# Patient Record
Sex: Female | Born: 1940 | Race: Black or African American | Hispanic: No | Smoking: Never smoker
Health system: Southern US, Community
[De-identification: ages and names within clinical notes are randomized; demographics above are authoritative.]

## PROBLEM LIST (undated history)

## (undated) DIAGNOSIS — H409 Unspecified glaucoma: Secondary | ICD-10-CM

## (undated) HISTORY — PX: EYE SURGERY: SHX253

## (undated) HISTORY — PX: ABDOMINAL HYSTERECTOMY: SHX81

## (undated) HISTORY — DX: Unspecified glaucoma: H40.9

---

## 1987-07-22 HISTORY — PX: BUNIONECTOMY: SHX129

## 1998-01-23 ENCOUNTER — Other Ambulatory Visit: Admission: RE | Admit: 1998-01-23 | Discharge: 1998-01-23 | Payer: Self-pay | Admitting: Obstetrics

## 1998-02-15 ENCOUNTER — Ambulatory Visit (HOSPITAL_COMMUNITY): Admission: RE | Admit: 1998-02-15 | Discharge: 1998-02-15 | Payer: Self-pay | Admitting: Cardiology

## 1999-02-18 ENCOUNTER — Other Ambulatory Visit: Admission: RE | Admit: 1999-02-18 | Discharge: 1999-02-18 | Payer: Self-pay | Admitting: Obstetrics

## 2000-01-27 ENCOUNTER — Other Ambulatory Visit: Admission: RE | Admit: 2000-01-27 | Discharge: 2000-01-27 | Payer: Self-pay | Admitting: Obstetrics

## 2003-05-23 ENCOUNTER — Other Ambulatory Visit: Admission: RE | Admit: 2003-05-23 | Discharge: 2003-05-23 | Payer: Self-pay | Admitting: Obstetrics and Gynecology

## 2003-07-22 HISTORY — PX: BUNIONECTOMY: SHX129

## 2004-02-23 ENCOUNTER — Encounter: Admission: RE | Admit: 2004-02-23 | Discharge: 2004-04-16 | Payer: Self-pay | Admitting: Podiatry

## 2006-01-13 ENCOUNTER — Ambulatory Visit (HOSPITAL_COMMUNITY): Admission: RE | Admit: 2006-01-13 | Discharge: 2006-01-13 | Payer: Self-pay | Admitting: Gastroenterology

## 2006-03-12 ENCOUNTER — Encounter: Admission: RE | Admit: 2006-03-12 | Discharge: 2006-03-12 | Payer: Self-pay | Admitting: Podiatry

## 2006-04-27 ENCOUNTER — Encounter: Admission: RE | Admit: 2006-04-27 | Discharge: 2006-04-27 | Payer: Self-pay | Admitting: Internal Medicine

## 2006-05-12 ENCOUNTER — Ambulatory Visit (HOSPITAL_COMMUNITY): Admission: RE | Admit: 2006-05-12 | Discharge: 2006-05-12 | Payer: Self-pay | Admitting: Internal Medicine

## 2006-07-21 HISTORY — PX: HAMMER TOE SURGERY: SHX385

## 2006-10-01 ENCOUNTER — Encounter: Admission: RE | Admit: 2006-10-01 | Discharge: 2006-10-01 | Payer: Self-pay | Admitting: General Surgery

## 2006-10-14 ENCOUNTER — Encounter: Admission: RE | Admit: 2006-10-14 | Discharge: 2006-10-14 | Payer: Self-pay | Admitting: General Surgery

## 2006-10-14 ENCOUNTER — Encounter (INDEPENDENT_AMBULATORY_CARE_PROVIDER_SITE_OTHER): Payer: Self-pay | Admitting: Specialist

## 2006-10-14 ENCOUNTER — Other Ambulatory Visit: Admission: RE | Admit: 2006-10-14 | Discharge: 2006-10-14 | Payer: Self-pay | Admitting: Diagnostic Radiology

## 2007-05-28 ENCOUNTER — Ambulatory Visit: Payer: Self-pay | Admitting: Internal Medicine

## 2007-06-11 ENCOUNTER — Encounter: Payer: Self-pay | Admitting: Internal Medicine

## 2007-06-11 ENCOUNTER — Ambulatory Visit: Payer: Self-pay | Admitting: Internal Medicine

## 2007-10-06 ENCOUNTER — Encounter: Admission: RE | Admit: 2007-10-06 | Discharge: 2007-10-06 | Payer: Self-pay

## 2007-10-20 ENCOUNTER — Encounter (INDEPENDENT_AMBULATORY_CARE_PROVIDER_SITE_OTHER): Payer: Self-pay | Admitting: Interventional Radiology

## 2007-10-20 ENCOUNTER — Other Ambulatory Visit: Admission: RE | Admit: 2007-10-20 | Discharge: 2007-10-20 | Payer: Self-pay | Admitting: Interventional Radiology

## 2007-10-20 ENCOUNTER — Encounter: Admission: RE | Admit: 2007-10-20 | Discharge: 2007-10-20 | Payer: Self-pay | Admitting: Internal Medicine

## 2008-08-04 IMAGING — CR DG FOOT COMPLETE 3+V*R*
3 series · 3 of 3 positions shown · non-contrast
Comparison: [REDACTED] right foot radiographs 03/12/06.

CLINICAL DATA: Painful right foot.  Postop right first metatarsal tarsal and distal right second metatarsal with persistent right foot pain. 
NUCLEAR MEDICINE 3-PHASE BONE SCAN BILATERAL FEET:
TECHNIQUE: Radionuclide angiographic, immediate static, and 3-hour delayed images were obtained after intravenous injection of radiopharmaceutical.
Radiopharmaceutical:  24.8 mCi Mc-RRm MDP.

[t foot ap right]
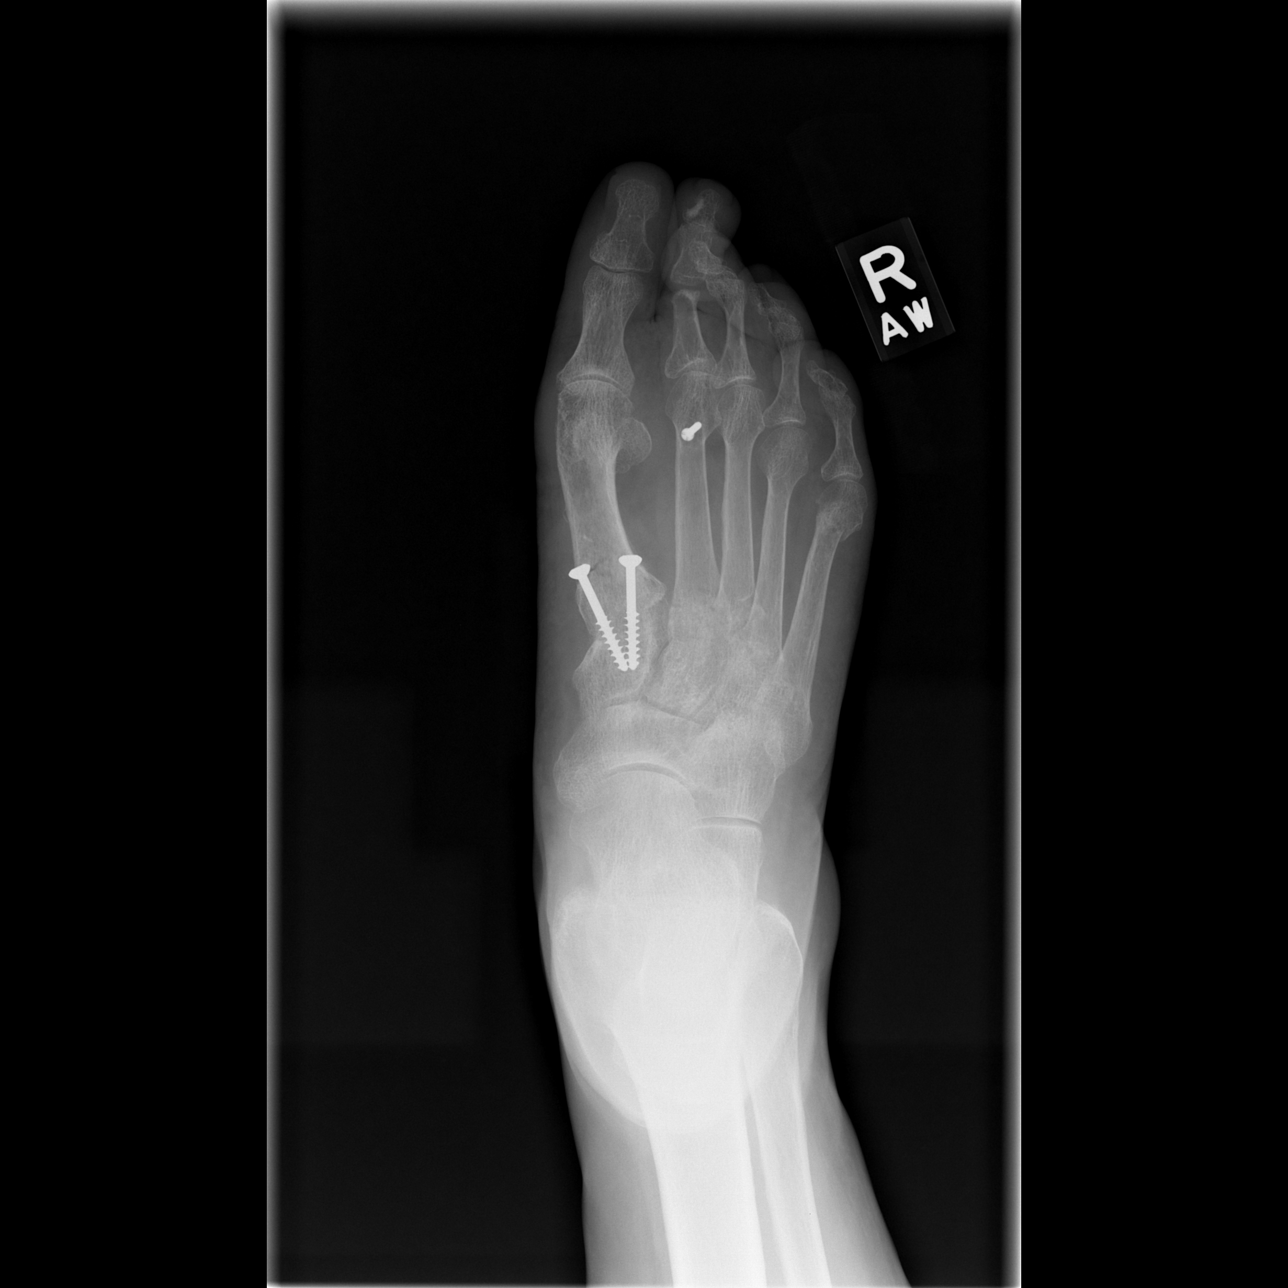

[t foot oblique right]
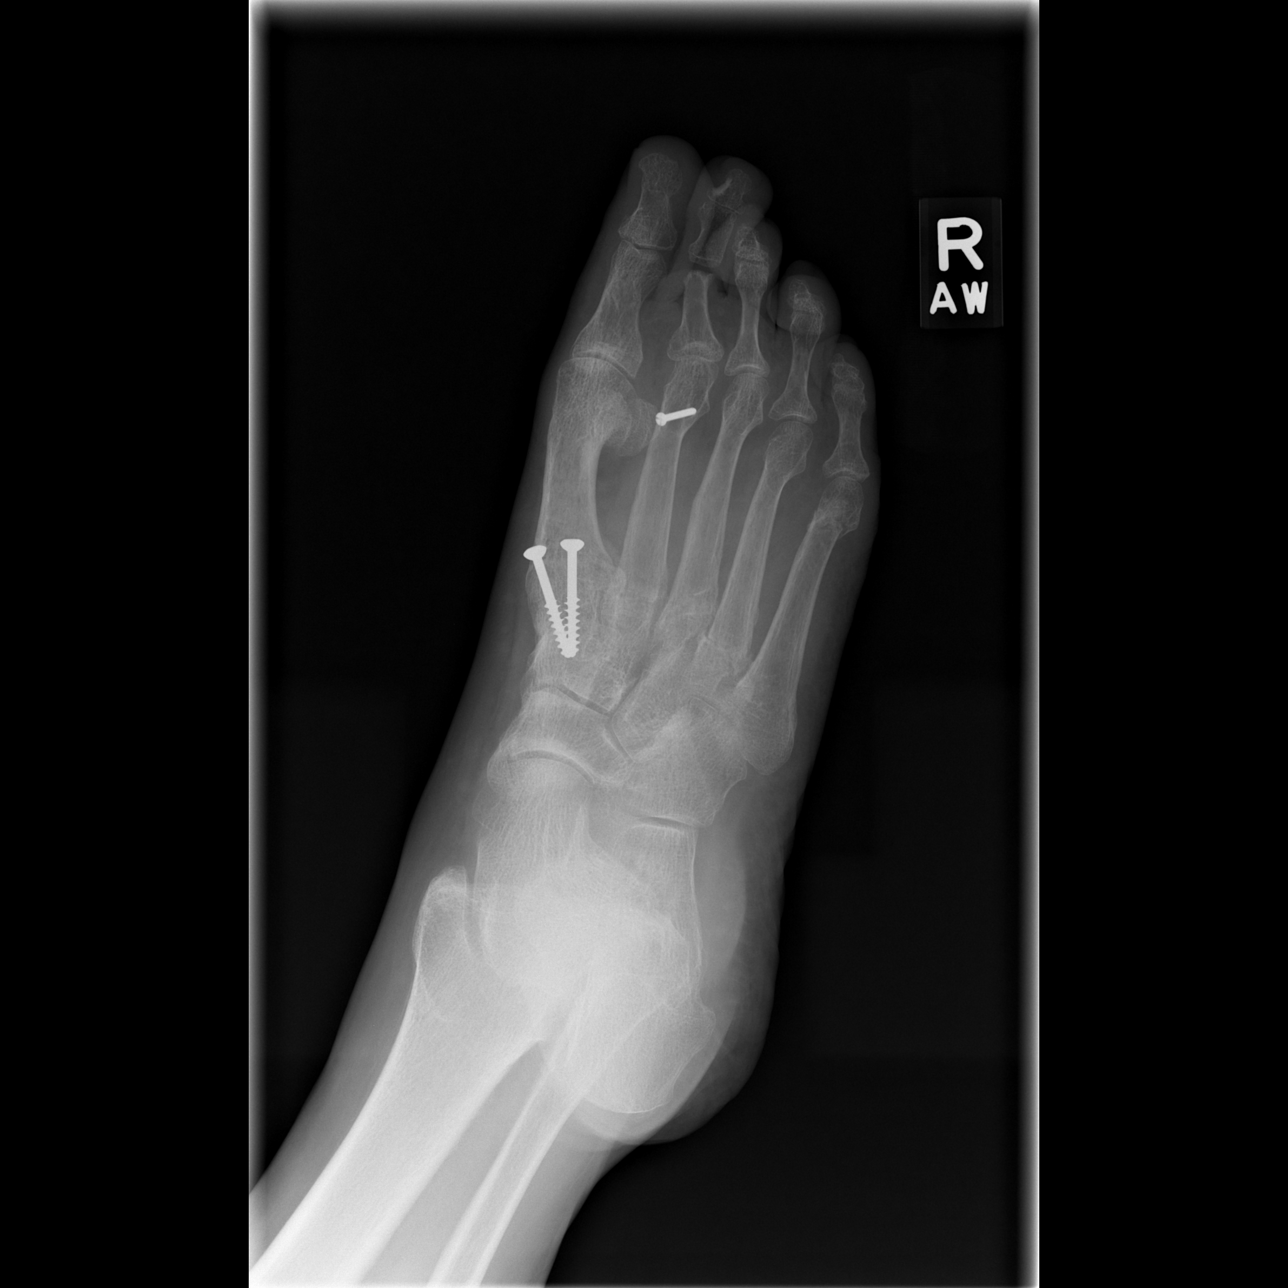

[t foot lat right]
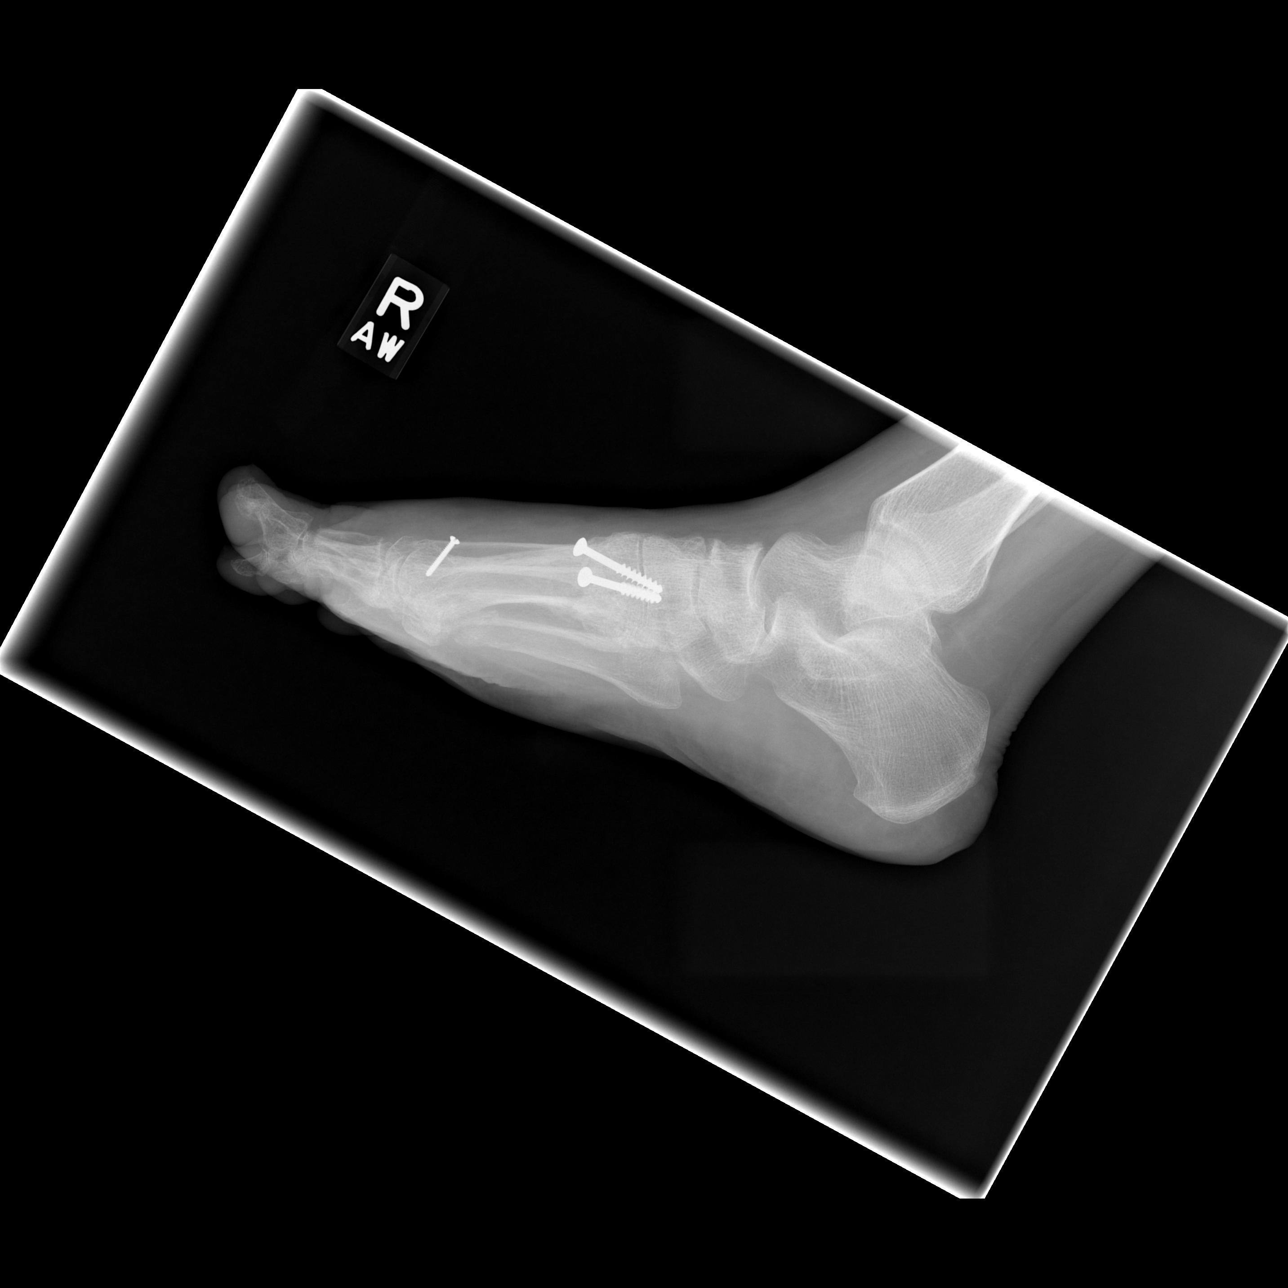

[3 of 3 positions shown; findings below may reference images not displayed]

FINDINGS: Dynamic first phase demonstrate increased uptake to the right midtarsal bones with progressive uptake through second and third phases at the level of the metatarsals and metatarsal tarsal articulations.  Specific uptake is seen most prominently at first through third bases of metatarsals and at the right 5th metatarsal head.  Lesser uptake is seen at the metatarsophalangeal joint 1-5 right.  No abnormality is seen at the left foot.
IMPRESSION: 1.  Progressive increased uptake primarily at the proximal right first through third metatarsals with radiographically demonstrated subtle fractures at the base of the right first , fourth and possibly second metatarsals.  Increased uptake is seen at the nondisplaced fracture at the right 5th metatarsal neck.  Lesser focal uptake is seen diffusely at the metatarsophalangeal joints at the right first through fifth toes. Reflex sympathetic dystrophy syndrome is possible yet felt less likely.
2.  Otherwise no significant abnormality. 

RIGHT FOOT ? 3 VIEW:
FINDINGS: Subtle nondisplaced fractures are seen at the bases of the right first and fourth metatarsal and probably medially at the second base metatarsal.  Nondisplaced fracture is seen at the right fifth metatarsal neck.  Transfixion screws are seen with osseous fusion at the right first metatarsal tarsal joint and screw at right second metatarsal neck.  No radiographic evidence for acute or chronic osteomyelitis is seen with diffuse osteopenia.  Probable postmenopausal etiology of osteopenia is noted although reflex sympathetic dystrophy syndrome is possible.
IMPRESSION: 1.  Subtle fractures right first, second, fourth, and fifth metatarsals with diffuse osteopenia. 
2.  Postop changes.
3.  Otherwise no significant abnormality.

## 2010-08-10 ENCOUNTER — Encounter: Payer: Self-pay | Admitting: Internal Medicine

## 2011-05-19 ENCOUNTER — Other Ambulatory Visit: Payer: Self-pay | Admitting: Ophthalmology

## 2011-07-22 HISTORY — PX: HAMMER TOE SURGERY: SHX385

## 2012-05-19 ENCOUNTER — Ambulatory Visit (INDEPENDENT_AMBULATORY_CARE_PROVIDER_SITE_OTHER): Payer: Medicare Other | Admitting: *Deleted

## 2012-05-19 DIAGNOSIS — Z23 Encounter for immunization: Secondary | ICD-10-CM

## 2012-07-16 ENCOUNTER — Encounter: Payer: Self-pay | Admitting: Internal Medicine

## 2012-10-15 ENCOUNTER — Other Ambulatory Visit: Payer: Self-pay | Admitting: Ophthalmology

## 2012-10-15 ENCOUNTER — Ambulatory Visit
Admission: RE | Admit: 2012-10-15 | Discharge: 2012-10-15 | Disposition: A | Discharge status: Home / Self Care | Payer: Medicare Other | Source: Ambulatory Visit | Attending: Ophthalmology | Admitting: Ophthalmology

## 2012-10-15 DIAGNOSIS — H0520 Unspecified exophthalmos: Secondary | ICD-10-CM

## 2013-03-18 ENCOUNTER — Encounter: Payer: Self-pay | Admitting: Internal Medicine

## 2013-06-13 ENCOUNTER — Ambulatory Visit (INDEPENDENT_AMBULATORY_CARE_PROVIDER_SITE_OTHER): Payer: Medicare Other | Admitting: *Deleted

## 2013-06-13 DIAGNOSIS — Z23 Encounter for immunization: Secondary | ICD-10-CM

## 2013-06-13 NOTE — Progress Notes (Signed)
  Subjective:    Patient ID: Paige Ross, female    DOB: August 02, 1940, 72 y.o.   MRN: [id number]  HPI    Review of Systems     Objective:   Physical Exam        Assessment & Plan:  Patient here for flu injection only.

## 2014-05-21 LAB — HM MAMMOGRAPHY

## 2014-06-09 ENCOUNTER — Ambulatory Visit (INDEPENDENT_AMBULATORY_CARE_PROVIDER_SITE_OTHER): Payer: Medicare Other | Admitting: *Deleted

## 2014-06-09 DIAGNOSIS — Z23 Encounter for immunization: Secondary | ICD-10-CM

## 2014-09-22 ENCOUNTER — Ambulatory Visit (INDEPENDENT_AMBULATORY_CARE_PROVIDER_SITE_OTHER): Payer: Medicare Other | Admitting: Podiatry

## 2014-09-22 ENCOUNTER — Encounter: Payer: Self-pay | Admitting: Podiatry

## 2014-09-22 VITALS — BP 119/65 | HR 64 | Ht 62.0 in | Wt 165.0 lb

## 2014-09-22 DIAGNOSIS — B351 Tinea unguium: Secondary | ICD-10-CM

## 2014-09-22 DIAGNOSIS — L600 Ingrowing nail: Secondary | ICD-10-CM

## 2014-09-22 DIAGNOSIS — M79676 Pain in unspecified toe(s): Secondary | ICD-10-CM | POA: Diagnosis not present

## 2014-09-22 DIAGNOSIS — L84 Corns and callosities: Secondary | ICD-10-CM

## 2014-09-22 DIAGNOSIS — H409 Unspecified glaucoma: Secondary | ICD-10-CM | POA: Insufficient documentation

## 2014-09-22 NOTE — Progress Notes (Signed)
   Subjective:    Patient ID: Paige SpinnerDemetra D Ross, female    DOB: 03-31-1941, 74 y.o.   MRN: [id number]  HPI 74 year old female presents the office today with complaints of painful, thick, elongated toenails as well as some hard skin on areas of the toes. She denies any redness or drainage on the nail sites. No other complaints at this time. Denies any systemic complaints such as fevers, chills, nausea, vomiting.   Review of Systems  All other systems reviewed and are negative.      Objective:   Physical Exam AAO 3, NAD DP/PT pulses palpable, CRT less than 3 seconds Protective sensation intact with Simms weinstein monofilament, vibratory sensation intact, Achilles tendon reflex intact. Nails are hypertrophic, dystrophic, elongated, brittle, discolored with mild incurvation of the nail borders 10. Upon debridement the lateral aspect of the right hallux nail distally there was a small of serous drainage. Upon debridement of the nail no further drainage was identified and there was no further tenderness after debridement. There is injected tenderness along nails 1-5 bilaterally. No swelling erythema around the nails. There is small hyperkeratotic lesion on the lateral aspect of the right fifth toe as well as the right first, left second and third digits adjacent to the nails. Upon debridement lesions there is no underlying ulceration, drainage or other signs of infection. No other areas of tenderness to bilateral lower extremities.  No other open lesions or pre-ulcer lesions identified bilaterally No pain with calf compression, swelling, warmth, erythema      Assessment & Plan:  74 year old female symptomatic onychomycosis/ingrown toenail, hyperkerotic lesions -Treatment options were discussed with patient including alternatives, risks, complications -Nail sharply debrided 10 without complication/bleeding. Upon debridement of the lateral aspect of the right hallux toenail there was a  small, serous drainage. Discussed the patient partial nail avulsion however she declined at this time. After debridement there was no further drainage and no further tenderness upon palpation. Recommended close evaluation of this area and there is any changes to call the office immediately. -Hyperkeratotic lesion sharply debrided without complications/bleeding. -Discussed daily foot inspection. -Follow-up in 3 months or sooner should any problems arise. In the meantime, cursory call the office if any questions, concerns, change in symptoms.

## 2014-09-22 NOTE — Patient Instructions (Signed)

## 2015-01-10 ENCOUNTER — Encounter: Payer: Self-pay | Admitting: *Deleted

## 2015-04-11 ENCOUNTER — Encounter: Payer: Self-pay | Admitting: Podiatry

## 2015-04-11 ENCOUNTER — Ambulatory Visit (INDEPENDENT_AMBULATORY_CARE_PROVIDER_SITE_OTHER): Payer: Medicare Other | Admitting: Podiatry

## 2015-04-11 VITALS — BP 133/71 | HR 64 | Resp 16

## 2015-04-11 DIAGNOSIS — B351 Tinea unguium: Secondary | ICD-10-CM | POA: Diagnosis not present

## 2015-04-11 DIAGNOSIS — M79676 Pain in unspecified toe(s): Secondary | ICD-10-CM | POA: Diagnosis not present

## 2015-04-11 DIAGNOSIS — L84 Corns and callosities: Secondary | ICD-10-CM

## 2015-04-11 NOTE — Progress Notes (Signed)
Subjective:     Patient ID: Paige Ross, female   DOB: Dec 03, 1940, 74 y.o.   MRN: [id number]  HPI patient presents with nail disease and thickness 1-5 both feet that patient cannot cut along with keratotic lesion third and fourth toes right third left that are painful when pressed   Review of Systems     Objective:   Physical Exam Neurovascular status unchanged with thick yellow brittle nailbeds and keratotic lesions bilateral    Assessment:     Mycotic nail infection with lesion formation    Plan:     Debride nailbeds that are painful 1-5 both feet and debridement lesions on both feet with no iatrogenic bleeding noted

## 2015-04-26 ENCOUNTER — Ambulatory Visit (INDEPENDENT_AMBULATORY_CARE_PROVIDER_SITE_OTHER): Payer: Medicare Other | Admitting: Podiatry

## 2015-04-26 ENCOUNTER — Encounter: Payer: Self-pay | Admitting: Podiatry

## 2015-04-26 DIAGNOSIS — M79676 Pain in unspecified toe(s): Secondary | ICD-10-CM

## 2015-04-26 DIAGNOSIS — B351 Tinea unguium: Secondary | ICD-10-CM | POA: Diagnosis not present

## 2015-04-28 NOTE — Progress Notes (Signed)
Subjective:     Patient ID: Paige Ross, female   DOB: [birth year], 74 y.o.   MRN: [id number]  HPI patient presents with nail disease 1-5 both feet along with lesions that can become painful fifth toe bilateral   Review of Systems     Objective:   Physical Exam Neurovascular status intact no other health history changes noted with patient having disease nailbeds 1-5 both feet with thickness yellow brittle type appearance with pain and keratotic lesions fifth toe both feet    Assessment:     Mycotic nail infections with lesion formation    Plan:     Debride painful nailbeds 1-5 both feet and lesions with no iatrogenic bleeding noted

## 2015-06-11 ENCOUNTER — Ambulatory Visit (INDEPENDENT_AMBULATORY_CARE_PROVIDER_SITE_OTHER): Payer: Medicare Other

## 2015-06-11 DIAGNOSIS — Z23 Encounter for immunization: Secondary | ICD-10-CM

## 2015-06-27 ENCOUNTER — Ambulatory Visit (INDEPENDENT_AMBULATORY_CARE_PROVIDER_SITE_OTHER): Payer: Medicare Other | Admitting: Podiatry

## 2015-06-27 ENCOUNTER — Encounter: Payer: Self-pay | Admitting: Podiatry

## 2015-06-27 VITALS — BP 143/85 | HR 75 | Resp 16

## 2015-06-27 DIAGNOSIS — L600 Ingrowing nail: Secondary | ICD-10-CM | POA: Diagnosis not present

## 2015-06-27 DIAGNOSIS — M79673 Pain in unspecified foot: Secondary | ICD-10-CM

## 2015-06-27 NOTE — Patient Instructions (Signed)

## 2015-06-28 NOTE — Progress Notes (Signed)
Subjective:     Patient ID: Paige Ross, female   DOB: 05/06/1941, 74 y.o.   MRN: [id number]  HPI patient presents with a damaged big toenail right lateral border that's very sore and she cannot cut or trim herself   Review of Systems     Objective:   Physical Exam Neurovascular status was found to be intact with muscle strength adequate range of motion within normal limits. Patient is noted to have an incurvated right hallux lateral border that's painful when pressed with distal redness but no current drainage noted    Assessment:     Ingrown toenail deformity right hallux lateral border with pain    Plan:     Discussed treatment options and opted for correction explaining procedure and complications to patient. She wants the procedure and today I infiltrated 60 mg Xylocaine Marcaine mixture and under sterile conditions remove the lateral border exposed matrix and applied phenol 3 applications 30 seconds followed by alcohol lavage and sterile dressing. Gave instructions on soaks and reappoint

## 2015-07-02 ENCOUNTER — Telehealth: Payer: Self-pay | Admitting: *Deleted

## 2015-07-02 NOTE — Telephone Encounter (Signed)
Called patient at [phone] (Home #) to check to see how they were doing from their ingrown toenail procedure that was performed on Wednesday, June 27, 2015. Pt stated, " Feeling okay and has no pain". Pt has been soaking toe as well.

## 2015-10-19 ENCOUNTER — Ambulatory Visit (INDEPENDENT_AMBULATORY_CARE_PROVIDER_SITE_OTHER): Payer: Medicare Other | Admitting: Podiatry

## 2015-10-19 ENCOUNTER — Encounter: Payer: Self-pay | Admitting: Podiatry

## 2015-10-19 DIAGNOSIS — L600 Ingrowing nail: Secondary | ICD-10-CM | POA: Diagnosis not present

## 2015-10-19 DIAGNOSIS — L84 Corns and callosities: Secondary | ICD-10-CM

## 2015-10-19 NOTE — Progress Notes (Signed)
Subjective:     Patient ID: Paige Ross, female   DOB: November 18, 1940, 75 y.o.   MRN: [id number]  HPI patient presents with soreness on the right hallux lateral border localized in nature and keratotic lesions plantar aspect both feet that are painful   Review of Systems     Objective:   Physical Exam Neurovascular status intact with slight incurvation hallux nail right with skin is grown up to the nail border but no drainage erythema noted. 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 If symptoms persist may require more nail removal

## 2016-02-11 ENCOUNTER — Encounter: Payer: Self-pay | Admitting: Podiatry

## 2016-02-11 ENCOUNTER — Ambulatory Visit (INDEPENDENT_AMBULATORY_CARE_PROVIDER_SITE_OTHER): Payer: Medicare Other | Admitting: Podiatry

## 2016-02-11 DIAGNOSIS — B351 Tinea unguium: Secondary | ICD-10-CM

## 2016-02-11 DIAGNOSIS — M79676 Pain in unspecified toe(s): Secondary | ICD-10-CM | POA: Diagnosis not present

## 2016-02-11 DIAGNOSIS — L84 Corns and callosities: Secondary | ICD-10-CM

## 2016-02-17 NOTE — Progress Notes (Signed)
Subjective: 75 y.o. returns the office today for painful, elongated, thickened toenails which she cannot trim herself. Denies any redness or drainage around the nails. She also has calluses she would like trimmed. Denies any acute changes since last appointment and no new complaints today. Denies any systemic complaints such as fevers, chills, nausea, vomiting.   Objective: AAO 3, NAD DP/PT pulses palpable, CRT less than 3 seconds Nails are hypertrophic, dystrophic, elongated, brittle, discolored with mild incurvation of the nail borders 10. There is tenderness along nails 1-5 bilaterally. No swelling erythema around the nails. There is small hyperkeratotic lesion on the lateral aspect of the right fifth toe as well as the right first, left second and third digits adjacent to the nails. Upon debridement lesions there is no underlying ulceration, drainage or other signs of infection. No other areas of tenderness to bilateral lower extremities.  No other open lesions or pre-ulcer lesions identified bilaterally No pain with calf compression, swelling, warmth, erythema  Assessment: Patient presents with symptomatic onychomycosis, hyperkeratotic lesions  Plan: -Treatment options including alternatives, risks, complications were discussed -Nails sharply debrided 10 without complication/bleeding. -Hyperkeratotic lesions debrided 2 without complications or bleeding. -Discussed daily foot inspection. If there are any changes, to call the office immediately.  -Follow-up in 3 months or sooner if any problems are to arise. In the meantime, encouraged to call the office with any questions, concerns, changes symptoms.  Ovid Curd, DPM

## 2016-04-22 ENCOUNTER — Ambulatory Visit: Payer: Medicare Other

## 2016-04-22 DIAGNOSIS — Z23 Encounter for immunization: Secondary | ICD-10-CM

## 2016-04-22 NOTE — Progress Notes (Unsigned)
Pt. Here for flu vaccine

## 2016-05-26 ENCOUNTER — Ambulatory Visit (INDEPENDENT_AMBULATORY_CARE_PROVIDER_SITE_OTHER): Payer: Medicare Other

## 2016-05-26 ENCOUNTER — Ambulatory Visit (INDEPENDENT_AMBULATORY_CARE_PROVIDER_SITE_OTHER): Payer: Medicare Other | Admitting: Podiatry

## 2016-05-26 DIAGNOSIS — M79676 Pain in unspecified toe(s): Secondary | ICD-10-CM | POA: Diagnosis not present

## 2016-05-26 DIAGNOSIS — R52 Pain, unspecified: Secondary | ICD-10-CM | POA: Diagnosis not present

## 2016-05-26 DIAGNOSIS — B351 Tinea unguium: Secondary | ICD-10-CM

## 2016-05-26 DIAGNOSIS — M79674 Pain in right toe(s): Secondary | ICD-10-CM

## 2016-05-26 DIAGNOSIS — Z969 Presence of functional implant, unspecified: Secondary | ICD-10-CM | POA: Diagnosis not present

## 2016-05-26 DIAGNOSIS — L84 Corns and callosities: Secondary | ICD-10-CM | POA: Diagnosis not present

## 2016-05-27 NOTE — Progress Notes (Signed)
Subjective: 75 y.o. returns the office today for painful, elongated, thickened toenails which she cannot trim herself. Denies any redness or drainage around the nails. She also has calluses she would like trimmed. She states that she feels that there is a bone on the right foot which started become painful she points the medial aspect of the right foot adjacent to the incision from the prior surgery. She denies any recent injury or trauma. Denies any open sores. Denies any swelling or redness. Denies any acute changes since last appointment and no new complaints today. Denies any systemic complaints such as fevers, chills, nausea, vomiting.   Objective: AAO 3, NAD DP/PT pulses palpable, CRT less than 3 seconds Nails are hypertrophic, dystrophic, elongated, brittle, discolored with mild incurvation of the nail borders 10. There is tenderness along nails 1-5 bilaterally. No swelling erythema around the nails. There is small hyperkeratotic lesion on the lateral aspect of the right fifth toe. Upon debridement lesions there is no underlying ulceration, drainage or other signs of infection. There is a palpable screw present on the right foot with mild tenderness to palpation over the area. She states the areas only really tender with pressure in shoe gear. There is no edema, erythema or any open sore any skin irritation.; , Hardware No other areas of tenderness to bilateral lower extremities.  No other open lesions or pre-ulcer lesions identified bilaterally No pain with calf compression, swelling, warmth, erythema  Assessment: Patient presents with symptomatic onychomycosis, hyperkeratotic lesions  Plan: -Treatment options including alternatives, risks, complications were discussed -Nails sharply debrided 10 without complication/bleeding. -Hyperkeratotic lesions debrided 2 without complications or bleeding. -X-rays were obtained and reviewed which didn't reveal prominent hardware. Skin marker was  utilized to confirm this. I discussed with her both conservative and surgical options. For now she will continue to monitor area. Offloading pads. She gear changes. In the future if symptoms worsen discussed surgical removal of the hardware. -Discussed daily foot inspection. If there are any changes, to call the office immediately.  -Follow-up in 3 months or sooner if any problems are to arise. In the meantime, encouraged to call the office with any questions, concerns, changes symptoms.  Ovid CurdMatthew Wagoner, DPM

## 2016-05-30 ENCOUNTER — Ambulatory Visit: Payer: Medicare Other | Admitting: Podiatry

## 2016-07-23 ENCOUNTER — Encounter (INDEPENDENT_AMBULATORY_CARE_PROVIDER_SITE_OTHER): Payer: Self-pay

## 2016-07-23 ENCOUNTER — Other Ambulatory Visit: Payer: Self-pay | Admitting: Internal Medicine

## 2016-07-23 ENCOUNTER — Ambulatory Visit (HOSPITAL_COMMUNITY)
Admission: RE | Admit: 2016-07-23 | Discharge: 2016-07-23 | Disposition: A | Discharge status: Home / Self Care | Payer: Medicare Other | Source: Ambulatory Visit | Attending: Internal Medicine | Admitting: Internal Medicine

## 2016-07-23 DIAGNOSIS — M25552 Pain in left hip: Secondary | ICD-10-CM | POA: Diagnosis not present

## 2016-07-23 DIAGNOSIS — R52 Pain, unspecified: Secondary | ICD-10-CM

## 2016-07-23 DIAGNOSIS — M25551 Pain in right hip: Secondary | ICD-10-CM | POA: Insufficient documentation

## 2016-07-23 DIAGNOSIS — M160 Bilateral primary osteoarthritis of hip: Secondary | ICD-10-CM | POA: Diagnosis not present

## 2016-07-24 ENCOUNTER — Ambulatory Visit (INDEPENDENT_AMBULATORY_CARE_PROVIDER_SITE_OTHER): Payer: Medicare Other | Admitting: Podiatry

## 2016-07-24 DIAGNOSIS — L84 Corns and callosities: Secondary | ICD-10-CM

## 2016-07-24 DIAGNOSIS — B351 Tinea unguium: Secondary | ICD-10-CM

## 2016-07-24 DIAGNOSIS — M79676 Pain in unspecified toe(s): Secondary | ICD-10-CM

## 2016-07-25 NOTE — Progress Notes (Signed)
Subjective: 76 y.o. returns the office today for painful painful calluses to her feet as well as for painful, elongated toenails to bilateral big toe and fifth digit toenails. She states the majority painful fifth toe on the hyperkeratotic lesion. She denies any recent injury or trauma. Denies any swelling or redness to her feet. Denies any acute changes since last appointment and no new complaints today. Denies any systemic complaints such as fevers, chills, nausea, vomiting.   Objective: AAO 3, NAD DP/PT pulses palpable, CRT less than 3 seconds Nails are hypertrophic, dystrophic, elongated, brittle, discolored with mild incurvation of the nail borders 4. There is tenderness along nails 1 and 5 bilaterallysurroundinghema around the nails. There is small hyperkeratotic lesion on the lateral aspect of the right fifth toe. Upon debridement lesions there is no underlying ulceration, drainage or other signs of infection.is also hyperkeratotic lesion to the right foot submetatarsal one. Upon debridement there is no underlying ulceration, drainage or any signs of infection.  No other areas of tenderness to bilateral lower extremities.  No other open lesions or pre-ulcer lesions identified bilaterally No pain with calf compression, swelling, warmth, erythema  Assessment: Patient presents with symptomatic onychomycosis, hyperkeratotic lesions  Plan: -Treatment options including alternatives, risks, complications were discussed -Nails sharply debrided 4 without complication/bleeding. -Hyperkeratotic lesions debrided 2 without complications or bleeding. -Arthroplasty of the toe. -Discussed daily foot inspection. If there are any changes, to call the office immediately.  -Follow-up in 3 months or sooner if any problems are to arise. In the meantime, encouraged to call the office with any questions, concerns, changes symptoms.  Ovid CurdMatthew Jhoselyn Ruffini, DPM

## 2016-12-08 ENCOUNTER — Ambulatory Visit: Payer: Medicare Other | Admitting: Podiatry

## 2016-12-19 ENCOUNTER — Encounter: Payer: Self-pay | Admitting: Podiatry

## 2016-12-19 ENCOUNTER — Ambulatory Visit (INDEPENDENT_AMBULATORY_CARE_PROVIDER_SITE_OTHER): Payer: Medicare Other | Admitting: Podiatry

## 2016-12-19 DIAGNOSIS — M79676 Pain in unspecified toe(s): Secondary | ICD-10-CM | POA: Diagnosis not present

## 2016-12-19 DIAGNOSIS — B351 Tinea unguium: Secondary | ICD-10-CM

## 2016-12-19 DIAGNOSIS — L84 Corns and callosities: Secondary | ICD-10-CM | POA: Diagnosis not present

## 2016-12-25 NOTE — Progress Notes (Signed)
Subjective: 76 y.o. returns the office today for painful painful calluses to her feet as well as for painful, elongated toenails to bilateral big toe and fifth digit toenails. She gets pain to the callus areas. She denies any recent injury or trauma. Denies any swelling or redness to her feet. Denies any acute changes since last appointment and no new complaints today. Denies any systemic complaints such as fevers, chills, nausea, vomiting.   Objective: AAO 3, NAD DP/PT pulses palpable, CRT less than 3 seconds Nails are hypertrophic, dystrophic, elongated, brittle, discolored with mild incurvation of the nail borders 4. There is tenderness along nails 1 and 5 bilaterally. There is no surrounding edema, erythema, drainage or pus. There is no clinical signs of infection. There is small hyperkeratotic lesion on the lateral aspect of the right fifth toe. Upon debridement lesions there is no underlying ulceration, drainage or other signs of infection.is also hyperkeratotic lesion to the right foot submetatarsal one. Upon debridement there is no underlying ulceration, drainage or any signs of infection.  No other areas of tenderness to bilateral lower extremities.  No other open lesions or pre-ulcer lesions identified bilaterally No pain with calf compression, swelling, warmth, erythema  Assessment: Patient presents with symptomatic onychomycosis, hyperkeratotic lesions  Plan: -Treatment options including alternatives, risks, complications were discussed -Nails sharply debrided 4 without complication/bleeding. -Hyperkeratotic lesions debrided 2 without complications or bleeding. -Discussed daily foot inspection. If there are any changes, to call the office immediately.  -Follow-up in 3 months or sooner if any problems are to arise. In the meantime, encouraged to call the office with any questions, concerns, changes symptoms.  Ovid CurdMatthew Blakely Gluth, DPM

## 2017-01-02 ENCOUNTER — Ambulatory Visit (INDEPENDENT_AMBULATORY_CARE_PROVIDER_SITE_OTHER): Payer: Medicare Other | Admitting: Podiatry

## 2017-01-02 ENCOUNTER — Encounter: Payer: Self-pay | Admitting: Podiatry

## 2017-01-02 DIAGNOSIS — B351 Tinea unguium: Secondary | ICD-10-CM

## 2017-01-02 DIAGNOSIS — M79676 Pain in unspecified toe(s): Secondary | ICD-10-CM

## 2017-01-02 NOTE — Progress Notes (Signed)
Subjective: 76 year old female presents the office today for concerns for right fifth toe along a corn that is still painful. I last saw her 2 weeks ago and areas trimmed however she states that the area still painful. Denies any redness or drainage or any swelling and she is still filling callus along the area. Denies any changes last appointment and no recent injury. The areas painful shoe gear and pressure. Denies any systemic complaints such as fevers, chills, nausea, vomiting. No acute changes since last appointment, and no other complaints at this time.   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 The dorsal lateral aspect of the right fifth toes hyperkeratotic lesion. Upon debridement there is no underlying ulceration, drainage or any signs of infection. No other open lesions or pre-ulcerative lesions identified today.  Tenderness to the fifth toe on the callus. No the area tenderness. 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  -Lesion was sharply debrided to any complications or bleeding. Continue offloading pads in shoe gear modifications discussed. -Follow scheduled for routine care or sooner if needed. -Patient encouraged to call the office with any questions, concerns, change in symptoms.   Ovid CurdMatthew Neville Walston, DPM

## 2017-04-02 ENCOUNTER — Ambulatory Visit (INDEPENDENT_AMBULATORY_CARE_PROVIDER_SITE_OTHER): Payer: Medicare Other | Admitting: Podiatry

## 2017-04-02 ENCOUNTER — Encounter: Payer: Self-pay | Admitting: Podiatry

## 2017-04-02 DIAGNOSIS — M79676 Pain in unspecified toe(s): Secondary | ICD-10-CM

## 2017-04-02 DIAGNOSIS — B351 Tinea unguium: Secondary | ICD-10-CM

## 2017-04-02 DIAGNOSIS — L84 Corns and callosities: Secondary | ICD-10-CM

## 2017-04-02 NOTE — Progress Notes (Signed)
Subjective: 76 y.o. returns the office today for painful, elongated, thickened toenails which she cannot trim herself. Denies any redness or drainage around the nails. She just gets calluses visit to her right foot. She denies any swelling or redness or any drainage coming from the area. Denies any acute changes since last appointment and no new complaints today. Denies any systemic complaints such as fevers, chills, nausea, vomiting.   Objective: AAO 3, NAD DP/PT pulses palpable, CRT less than 3 seconds Nails hypertrophic, dystrophic, elongated, brittle, discolored 10. There is tenderness overlying the nails 1-5 bilaterally. There is no surrounding erythema or drainage along the nail sites. Hyperkeratotic lesion dorsal lateral aspect of the right fifth toe, distals third toe, medial first MPJ on the right foot. There is no joint ulceration, drainage or any signs of infection present. Small palpable bone spur present on the lateral aspect of the first MPJ No open lesions or pre-ulcerative lesions are identified. No other areas of tenderness bilateral lower extremities. No overlying edema, erythema, increased warmth. No pain with calf compression, swelling, warmth, erythema.  Assessment: Patient presents with symptomatic onychomycosis; hyperkeratotic lesions   Plan: -Treatment options including alternatives, risks, complications were discussed -Nails sharply debrided 10 without complication/bleeding. -Hyperkeratotic lesion sharply debrided 3 without any complications or bleeding. -Offloading pad made for the right foot. -Discussed daily foot inspection. If there are any changes, to call the office immediately.  -Follow-up in 3 months or sooner if any problems are to arise. In the meantime, encouraged to call the office with any questions, concerns, changes symptoms.  Ovid CurdMatthew Jeris Roser, DPM

## 2017-06-08 ENCOUNTER — Telehealth: Payer: Self-pay | Admitting: *Deleted

## 2017-06-08 NOTE — Telephone Encounter (Signed)
Pt states she has a lot of pressure and soreness beneath her toe. I asked pt if she had injured the area or if there was redness, drainage and swelling. Pt denies and states she is scheduled for 06/22/2017. I told pt to call if any symptoms of infection occurred. Pt states understanding.

## 2017-06-22 ENCOUNTER — Encounter: Payer: Self-pay | Admitting: Podiatry

## 2017-06-22 ENCOUNTER — Ambulatory Visit: Payer: Medicare Other | Admitting: Podiatry

## 2017-06-22 DIAGNOSIS — L84 Corns and callosities: Secondary | ICD-10-CM

## 2017-06-22 DIAGNOSIS — M79676 Pain in unspecified toe(s): Secondary | ICD-10-CM | POA: Diagnosis not present

## 2017-06-22 DIAGNOSIS — B351 Tinea unguium: Secondary | ICD-10-CM

## 2017-06-29 NOTE — Progress Notes (Signed)
Subjective: 76 y.o. returns the office today for painful, elongated, thickened toenails which she cannot trim herself. Denies any redness or drainage around the nails.  She states that she does get some pain in the tip of her right third toe she states that it hurts with pressure in certain shoes.  She denies any skin breakdown she denies any swelling or redness or any drainage coming from the area.  Denies any acute changes since last appointment and no new complaints today. Denies any systemic complaints such as fevers, chills, nausea, vomiting.   Objective: AAO 3, NAD DP/PT pulses palpable, CRT less than 3 seconds Nails hypertrophic, dystrophic, elongated, brittle, discolored 10. There is tenderness overlying the nails 1-5 bilaterally. There is no surrounding erythema or drainage along the nail sites. Hyperkeratotic lesion of the distal aspect the right third toe.  Upon debridement there is no underlying ulceration, drainage or any clinical signs of infection noted.  There is no swelling to the toe. No other open lesions or pre-ulcerative lesions are identified. No other areas of tenderness bilateral lower extremities. No overlying edema, erythema, increased warmth. No pain with calf compression, swelling, warmth, erythema.  Assessment: Patient presents with symptomatic onychomycosis; hyperkeratotic lesions   Plan: -Treatment options including alternatives, risks, complications were discussed -Nails sharply debrided 10 without complication/bleeding. -Hyperkeratotic lesion sharply debrided 1 without any complications or bleeding. -Offloading pad made for the right foot. -Discussed daily foot inspection. If there are any changes, to call the office immediately.  -Follow-up in 3 months or sooner if any problems are to arise. In the meantime, encouraged to call the office with any questions, concerns, changes symptoms.  Ovid CurdMatthew Wagoner, DPM

## 2017-07-06 ENCOUNTER — Ambulatory Visit: Payer: Medicare Other | Admitting: Podiatry

## 2017-08-04 ENCOUNTER — Telehealth: Payer: Self-pay | Admitting: Podiatry

## 2017-08-04 NOTE — Telephone Encounter (Signed)
Left message informing pt that she should make an appt if she was continuing to have pain, to clean the toe daily with soap and warm water, rinse and put a light amount of neosporin on a bandaid daily until seen in office, and call [phone] for an appt.

## 2017-08-04 NOTE — Telephone Encounter (Signed)
I saw Dr. Ardelle AntonWagoner on 22 June 2017. I'm still experiencing pain in my 3rd toe where he removed the corn. I wanted to know what I can use for that. I have soaked in epsom salt and I've also used 3% peroxide. I need to know what he advises to improve this pain. My number is [phone]. Thank you.

## 2017-08-10 ENCOUNTER — Ambulatory Visit: Payer: Medicare Other | Admitting: Podiatry

## 2017-08-10 DIAGNOSIS — M2061 Acquired deformities of toe(s), unspecified, right foot: Secondary | ICD-10-CM | POA: Diagnosis not present

## 2017-08-10 DIAGNOSIS — L84 Corns and callosities: Secondary | ICD-10-CM | POA: Diagnosis not present

## 2017-08-11 NOTE — Progress Notes (Signed)
Subjective: [name] presents the office with concerns of pain to the tip of the right third toe and she points to the distal plantar aspect of the digit.  She said the area is painful with pressure in shoes.  She has tried soaking in Epsom salts, antibiotic ointment, Band-Aid without any improvement.  She does wear a foam pad which does help some when she walks.  She denies any recent injury or trauma she denies any swelling or redness.  She has no other concerns. Denies any systemic complaints such as fevers, chills, nausea, vomiting. No acute changes since last appointment, and no other complaints at this time.   Objective: AAO x3, NAD DP/PT pulses palpable bilaterally, CRT less than 3 seconds Hyperkeratotic lesion to the distal aspect on the plantar right third toe.  Upon debridement there is no underlying ulceration, drainage or any clinical signs of infection noted.  There is a ductus of the third, fourth, fifth digit on the right foot and the second digit is overlapping the third toe pushing the third toe down.  I think this is likely the cause of the hyperkeratotic tissue and pressure. No open lesions or pre-ulcerative lesions.  No pain with calf compression, swelling, warmth, erythema  Assessment: Hyperkeratotic tissue resulting from toe deformity  Plan: -All treatment options discussed with the patient including all alternatives, risks, complications.  -Debrided the hyperkeratotic lesion of the area was cleaned with alcohol and 312 blade scalpel without any complications or bleeding.  Discussed that we need to take more pressure off the area which is causing the lesion.  I dispensed various offloading pads for her.  She is previously had 3 surgeries on her toes.  She would like to hold off any further surgery at this point I agree is am not sure if this will be helpful for her.  Daily foot inspection discussed. -Follow-up as scheduled or sooner if needed.  Call any questions or  concerns. -Patient encouraged to call the office with any questions, concerns, change in symptoms.   Paige Ross DPM

## 2017-08-25 ENCOUNTER — Telehealth: Payer: Self-pay | Admitting: Podiatry

## 2017-08-25 NOTE — Telephone Encounter (Signed)
I informed pt she would be able to purchase the crescents in our office and the front reception staff could help her.

## 2017-08-25 NOTE — Telephone Encounter (Signed)
I saw Dr. Ardelle AntonWagoner a few weeks ago and my foot problem has cleared up considerably. What I need to know is where I can buy a small right crescent to separator or if I can buy them at your office. Please call me back at [phone]. Thank you.

## 2017-10-26 ENCOUNTER — Ambulatory Visit: Payer: Medicare Other | Admitting: Podiatry

## 2017-11-17 ENCOUNTER — Ambulatory Visit: Payer: Medicare Other | Admitting: Podiatry

## 2017-11-17 DIAGNOSIS — M79676 Pain in unspecified toe(s): Secondary | ICD-10-CM | POA: Diagnosis not present

## 2017-11-17 DIAGNOSIS — L84 Corns and callosities: Secondary | ICD-10-CM

## 2017-11-17 DIAGNOSIS — B351 Tinea unguium: Secondary | ICD-10-CM

## 2017-11-17 NOTE — Progress Notes (Signed)
Subjective: 76 y.o. returns the office today for painful, elongated, thickened toenails which she cannot trim herself. Denies any redness or drainage around the nails. She also gets a painful callus to the right 5th toe with pressure and shoes. no redness or drainage. Denies any acute changes since last appointment and no new complaints today. Denies any systemic complaints such as fevers, chills, nausea, vomiting.   PCP: Laurena Slimmer, MD:  Objective: AAO 3, NAD DP/PT pulses palpable, CRT less than 3 seconds Nails hypertrophic, dystrophic, elongated, brittle, discolored 10. There is tenderness overlying the nails 1-5 bilaterally. There is no surrounding erythema or drainage along the nail sites. Hyperkeratotic tissue right dorsal and lateral 5th digit. Upon debridement, no underlying ulceration, drainage, or signs of infection. 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  No open lesions or pre-ulcerative lesions are identified. No other areas of tenderness bilateral lower extremities. No overlying edema, erythema, increased warmth. No pain with calf compression, swelling, warmth, erythema.  Assessment: Patient presents with symptomatic onychomycosis; hyperkeratotic lesion.   Plan: -Treatment options including alternatives, risks, complications were discussed -Nails sharply debrided 10 without complication/bleeding. -Hyperkeratotic lesion debrided x 1 without complications or bleeding. Offloading -Discussed daily foot inspection. If there are any changes, to call the office immediately.  -Follow-up in 3 months or sooner if any problems are to arise. In the meantime, encouraged to call the office with any questions, concerns, changes symptoms.  Ovid Curd, DPM

## 2017-12-29 ENCOUNTER — Telehealth: Payer: Self-pay | Admitting: *Deleted

## 2017-12-29 NOTE — Telephone Encounter (Signed)
Pt states her little toe on the right foot is still sore and she wanted to know if she could get pad she could use.

## 2017-12-29 NOTE — Telephone Encounter (Signed)
Left message informing pt there were silicone toe caps that could be used, but if she had redness, drainage, or broken skin she should have an appt.

## 2018-02-09 ENCOUNTER — Ambulatory Visit: Payer: Medicare Other | Admitting: Podiatry

## 2018-02-09 DIAGNOSIS — L84 Corns and callosities: Secondary | ICD-10-CM

## 2018-02-09 DIAGNOSIS — B351 Tinea unguium: Secondary | ICD-10-CM | POA: Diagnosis not present

## 2018-02-09 DIAGNOSIS — M205X1 Other deformities of toe(s) (acquired), right foot: Secondary | ICD-10-CM | POA: Diagnosis not present

## 2018-02-09 DIAGNOSIS — M79676 Pain in unspecified toe(s): Secondary | ICD-10-CM | POA: Diagnosis not present

## 2018-02-10 NOTE — Progress Notes (Signed)
Subjective: 77 year old female presents the office today for concerns of a painful callus to the right fifth toe.  She says is been painful since I last saw her.  She has used some occasional offloading pads which helps some but does not limit the symptoms.  She denies any drainage or swelling to the area and she also states that she has a couple of toenails are thick and painful causing discomfort.  She has no other concerns. Denies any systemic complaints such as fevers, chills, nausea, vomiting. No acute changes since last appointment, and no other complaints at this time.   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  Upon debridement there is no underlying ulceration, drainage or any signs of infection present. The left second digit toenail as well as bilateral fifth digit toenails are hypertrophic, dystrophic and discolored causing pain.  No redness or drainage to the toenail sites. No open lesions or pre-ulcerative lesions.  No pain with calf compression, swelling, warmth, erythema  Assessment: Symptomatic hyperkeratotic lesion due to digital deformity, traumatic onychomycosis  Plan: -All treatment options discussed with the patient including all alternatives, risks, complications.  -In regards to the hyperkeratotic lesion of the right fifth toe I did sharply debride this but any complications or bleeding.  We also discussed that if symptoms continue we discussed surgical intervention including arthroplasty with derotational skin plasty.  We discussed the surgery and postoperative course and she is to consider her options.  I dispensed further offloading pads today we discussed shoe modifications as well. -Debrided the nails x3  without any complications or bleeding. -Patient encouraged to call the office with any questions, concerns, change in symptoms.   Vivi BarrackMatthew R Moris Ratchford DPM

## 2018-02-22 ENCOUNTER — Telehealth: Payer: Self-pay | Admitting: Podiatry

## 2018-02-22 NOTE — Telephone Encounter (Signed)
I'm a pt of Dr. Gabriel RungWagoner's. I was in on 23 July and I'm still having trouble with my foot being sore. I need to know what he would recommend and how often if I need to do a soaking, then how often does this need to be done? My phone number is [phone]. Thanks. Its in reference to my little toe on my right foot where he removed some of the corn when I was in. Thank you very much.

## 2018-02-22 NOTE — Telephone Encounter (Signed)
Unable to leave a message, phone was busy.

## 2018-06-28 ENCOUNTER — Ambulatory Visit
Admission: RE | Admit: 2018-06-28 | Discharge: 2018-06-28 | Disposition: A | Discharge status: Home / Self Care | Payer: Medicare Other | Source: Ambulatory Visit | Attending: Internal Medicine | Admitting: Internal Medicine

## 2018-06-28 ENCOUNTER — Other Ambulatory Visit: Payer: Self-pay | Admitting: Internal Medicine

## 2018-06-28 DIAGNOSIS — M5489 Other dorsalgia: Secondary | ICD-10-CM

## 2018-08-27 ENCOUNTER — Other Ambulatory Visit (HOSPITAL_COMMUNITY): Payer: Self-pay | Admitting: Internal Medicine

## 2018-08-27 ENCOUNTER — Other Ambulatory Visit: Payer: Self-pay | Admitting: Internal Medicine

## 2018-08-27 DIAGNOSIS — E213 Hyperparathyroidism, unspecified: Secondary | ICD-10-CM

## 2018-09-06 ENCOUNTER — Encounter (HOSPITAL_COMMUNITY): Payer: Medicare Other

## 2018-09-21 ENCOUNTER — Encounter (HOSPITAL_COMMUNITY)
Admission: RE | Admit: 2018-09-21 | Discharge: 2018-09-21 | Disposition: A | Discharge status: Home / Self Care | Payer: Medicare Other | Source: Ambulatory Visit | Attending: Internal Medicine | Admitting: Internal Medicine

## 2018-09-21 DIAGNOSIS — E213 Hyperparathyroidism, unspecified: Secondary | ICD-10-CM

## 2018-09-21 MED ORDER — TECHNETIUM TC 99M SESTAMIBI GENERIC - CARDIOLITE
25.0000 | Freq: Once | INTRAVENOUS | Status: AC | PRN
  Administered 2018-09-21: 25 via INTRAVENOUS

## 2019-05-20 ENCOUNTER — Other Ambulatory Visit: Payer: Self-pay | Admitting: Internal Medicine

## 2019-05-20 DIAGNOSIS — R946 Abnormal results of thyroid function studies: Secondary | ICD-10-CM

## 2019-05-30 ENCOUNTER — Other Ambulatory Visit: Payer: Medicare Other

## 2019-05-31 ENCOUNTER — Other Ambulatory Visit: Payer: Medicare Other

## 2019-06-14 ENCOUNTER — Ambulatory Visit
Admission: RE | Admit: 2019-06-14 | Discharge: 2019-06-14 | Disposition: A | Discharge status: Home / Self Care | Payer: Medicare Other | Source: Ambulatory Visit | Attending: Internal Medicine | Admitting: Internal Medicine

## 2019-06-14 DIAGNOSIS — R946 Abnormal results of thyroid function studies: Secondary | ICD-10-CM

## 2019-08-31 ENCOUNTER — Other Ambulatory Visit: Payer: Self-pay | Admitting: Internal Medicine

## 2019-08-31 DIAGNOSIS — R221 Localized swelling, mass and lump, neck: Secondary | ICD-10-CM

## 2019-09-05 ENCOUNTER — Ambulatory Visit
Admission: RE | Admit: 2019-09-05 | Discharge: 2019-09-05 | Disposition: A | Discharge status: Home / Self Care | Payer: Medicare PPO | Source: Ambulatory Visit | Attending: Internal Medicine | Admitting: Internal Medicine

## 2019-09-05 DIAGNOSIS — R221 Localized swelling, mass and lump, neck: Secondary | ICD-10-CM

## 2019-10-25 DIAGNOSIS — E210 Primary hyperparathyroidism: Secondary | ICD-10-CM | POA: Diagnosis not present

## 2019-10-25 DIAGNOSIS — E042 Nontoxic multinodular goiter: Secondary | ICD-10-CM | POA: Diagnosis not present

## 2019-10-25 DIAGNOSIS — M810 Age-related osteoporosis without current pathological fracture: Secondary | ICD-10-CM | POA: Diagnosis not present

## 2019-10-26 ENCOUNTER — Other Ambulatory Visit: Payer: Self-pay | Admitting: Internal Medicine

## 2019-10-26 DIAGNOSIS — E042 Nontoxic multinodular goiter: Secondary | ICD-10-CM

## 2019-11-09 DIAGNOSIS — T7840XA Allergy, unspecified, initial encounter: Secondary | ICD-10-CM | POA: Diagnosis not present

## 2019-11-09 DIAGNOSIS — H6121 Impacted cerumen, right ear: Secondary | ICD-10-CM | POA: Diagnosis not present

## 2019-11-15 ENCOUNTER — Other Ambulatory Visit (HOSPITAL_COMMUNITY)
Admission: RE | Admit: 2019-11-15 | Discharge: 2019-11-15 | Disposition: A | Discharge status: Home / Self Care | Payer: Medicare PPO | Source: Ambulatory Visit | Attending: Internal Medicine | Admitting: Internal Medicine

## 2019-11-15 ENCOUNTER — Ambulatory Visit
Admission: RE | Admit: 2019-11-15 | Discharge: 2019-11-15 | Disposition: A | Discharge status: Home / Self Care | Payer: Medicare PPO | Source: Ambulatory Visit | Attending: Internal Medicine | Admitting: Internal Medicine

## 2019-11-15 DIAGNOSIS — E042 Nontoxic multinodular goiter: Secondary | ICD-10-CM

## 2019-11-15 DIAGNOSIS — E041 Nontoxic single thyroid nodule: Secondary | ICD-10-CM | POA: Diagnosis not present

## 2019-11-16 LAB — CYTOLOGY - NON PAP

## 2019-12-06 DIAGNOSIS — J302 Other seasonal allergic rhinitis: Secondary | ICD-10-CM | POA: Diagnosis not present

## 2019-12-06 DIAGNOSIS — M810 Age-related osteoporosis without current pathological fracture: Secondary | ICD-10-CM | POA: Diagnosis not present

## 2019-12-06 DIAGNOSIS — Z0000 Encounter for general adult medical examination without abnormal findings: Secondary | ICD-10-CM | POA: Diagnosis not present

## 2019-12-06 DIAGNOSIS — E213 Hyperparathyroidism, unspecified: Secondary | ICD-10-CM | POA: Diagnosis not present

## 2019-12-06 DIAGNOSIS — Z23 Encounter for immunization: Secondary | ICD-10-CM | POA: Diagnosis not present

## 2019-12-06 DIAGNOSIS — Z1389 Encounter for screening for other disorder: Secondary | ICD-10-CM | POA: Diagnosis not present

## 2019-12-06 DIAGNOSIS — E7800 Pure hypercholesterolemia, unspecified: Secondary | ICD-10-CM | POA: Diagnosis not present

## 2019-12-30 DIAGNOSIS — H401132 Primary open-angle glaucoma, bilateral, moderate stage: Secondary | ICD-10-CM | POA: Diagnosis not present

## 2020-01-25 DIAGNOSIS — Z1231 Encounter for screening mammogram for malignant neoplasm of breast: Secondary | ICD-10-CM | POA: Diagnosis not present

## 2020-02-20 DIAGNOSIS — M1712 Unilateral primary osteoarthritis, left knee: Secondary | ICD-10-CM | POA: Diagnosis not present

## 2020-02-23 DIAGNOSIS — R0982 Postnasal drip: Secondary | ICD-10-CM | POA: Diagnosis not present

## 2020-02-23 DIAGNOSIS — K219 Gastro-esophageal reflux disease without esophagitis: Secondary | ICD-10-CM | POA: Insufficient documentation

## 2020-02-29 DIAGNOSIS — M1712 Unilateral primary osteoarthritis, left knee: Secondary | ICD-10-CM | POA: Diagnosis not present

## 2020-02-29 DIAGNOSIS — M6281 Muscle weakness (generalized): Secondary | ICD-10-CM | POA: Diagnosis not present

## 2020-02-29 DIAGNOSIS — R2689 Other abnormalities of gait and mobility: Secondary | ICD-10-CM | POA: Diagnosis not present

## 2020-03-06 DIAGNOSIS — M6281 Muscle weakness (generalized): Secondary | ICD-10-CM | POA: Diagnosis not present

## 2020-03-06 DIAGNOSIS — R2689 Other abnormalities of gait and mobility: Secondary | ICD-10-CM | POA: Diagnosis not present

## 2020-03-06 DIAGNOSIS — M1712 Unilateral primary osteoarthritis, left knee: Secondary | ICD-10-CM | POA: Diagnosis not present

## 2020-03-08 DIAGNOSIS — M1712 Unilateral primary osteoarthritis, left knee: Secondary | ICD-10-CM | POA: Diagnosis not present

## 2020-03-08 DIAGNOSIS — M6281 Muscle weakness (generalized): Secondary | ICD-10-CM | POA: Diagnosis not present

## 2020-03-08 DIAGNOSIS — R2689 Other abnormalities of gait and mobility: Secondary | ICD-10-CM | POA: Diagnosis not present

## 2020-03-12 DIAGNOSIS — M1712 Unilateral primary osteoarthritis, left knee: Secondary | ICD-10-CM | POA: Diagnosis not present

## 2020-03-12 DIAGNOSIS — M6281 Muscle weakness (generalized): Secondary | ICD-10-CM | POA: Diagnosis not present

## 2020-03-12 DIAGNOSIS — R2689 Other abnormalities of gait and mobility: Secondary | ICD-10-CM | POA: Diagnosis not present

## 2020-03-19 DIAGNOSIS — H401232 Low-tension glaucoma, bilateral, moderate stage: Secondary | ICD-10-CM | POA: Diagnosis not present

## 2020-03-20 DIAGNOSIS — R2689 Other abnormalities of gait and mobility: Secondary | ICD-10-CM | POA: Diagnosis not present

## 2020-03-20 DIAGNOSIS — M6281 Muscle weakness (generalized): Secondary | ICD-10-CM | POA: Diagnosis not present

## 2020-03-20 DIAGNOSIS — M1712 Unilateral primary osteoarthritis, left knee: Secondary | ICD-10-CM | POA: Diagnosis not present

## 2020-03-22 DIAGNOSIS — R2689 Other abnormalities of gait and mobility: Secondary | ICD-10-CM | POA: Diagnosis not present

## 2020-03-22 DIAGNOSIS — M6281 Muscle weakness (generalized): Secondary | ICD-10-CM | POA: Diagnosis not present

## 2020-03-22 DIAGNOSIS — M1712 Unilateral primary osteoarthritis, left knee: Secondary | ICD-10-CM | POA: Diagnosis not present

## 2020-03-27 DIAGNOSIS — M6281 Muscle weakness (generalized): Secondary | ICD-10-CM | POA: Diagnosis not present

## 2020-03-27 DIAGNOSIS — M1712 Unilateral primary osteoarthritis, left knee: Secondary | ICD-10-CM | POA: Diagnosis not present

## 2020-03-27 DIAGNOSIS — R2689 Other abnormalities of gait and mobility: Secondary | ICD-10-CM | POA: Diagnosis not present

## 2020-03-28 DIAGNOSIS — M65312 Trigger thumb, left thumb: Secondary | ICD-10-CM | POA: Diagnosis not present

## 2020-03-29 DIAGNOSIS — M6281 Muscle weakness (generalized): Secondary | ICD-10-CM | POA: Diagnosis not present

## 2020-03-29 DIAGNOSIS — M1712 Unilateral primary osteoarthritis, left knee: Secondary | ICD-10-CM | POA: Diagnosis not present

## 2020-03-29 DIAGNOSIS — R2689 Other abnormalities of gait and mobility: Secondary | ICD-10-CM | POA: Diagnosis not present

## 2020-04-26 DIAGNOSIS — L821 Other seborrheic keratosis: Secondary | ICD-10-CM | POA: Diagnosis not present

## 2020-04-26 DIAGNOSIS — L218 Other seborrheic dermatitis: Secondary | ICD-10-CM | POA: Diagnosis not present

## 2020-04-30 DIAGNOSIS — M810 Age-related osteoporosis without current pathological fracture: Secondary | ICD-10-CM | POA: Diagnosis not present

## 2020-04-30 DIAGNOSIS — Z23 Encounter for immunization: Secondary | ICD-10-CM | POA: Diagnosis not present

## 2020-04-30 DIAGNOSIS — E210 Primary hyperparathyroidism: Secondary | ICD-10-CM | POA: Diagnosis not present

## 2020-04-30 DIAGNOSIS — H5789 Other specified disorders of eye and adnexa: Secondary | ICD-10-CM | POA: Diagnosis not present

## 2020-04-30 DIAGNOSIS — E042 Nontoxic multinodular goiter: Secondary | ICD-10-CM | POA: Diagnosis not present

## 2020-05-21 DIAGNOSIS — L84 Corns and callosities: Secondary | ICD-10-CM | POA: Diagnosis not present

## 2020-05-21 DIAGNOSIS — L602 Onychogryphosis: Secondary | ICD-10-CM | POA: Diagnosis not present

## 2020-05-21 DIAGNOSIS — I70293 Other atherosclerosis of native arteries of extremities, bilateral legs: Secondary | ICD-10-CM | POA: Diagnosis not present

## 2020-05-21 DIAGNOSIS — M792 Neuralgia and neuritis, unspecified: Secondary | ICD-10-CM | POA: Diagnosis not present

## 2020-05-21 DIAGNOSIS — M21961 Unspecified acquired deformity of right lower leg: Secondary | ICD-10-CM | POA: Diagnosis not present

## 2020-05-21 DIAGNOSIS — M21962 Unspecified acquired deformity of left lower leg: Secondary | ICD-10-CM | POA: Diagnosis not present

## 2020-06-28 DIAGNOSIS — M21961 Unspecified acquired deformity of right lower leg: Secondary | ICD-10-CM | POA: Diagnosis not present

## 2020-06-28 DIAGNOSIS — I70293 Other atherosclerosis of native arteries of extremities, bilateral legs: Secondary | ICD-10-CM | POA: Diagnosis not present

## 2020-06-28 DIAGNOSIS — M21962 Unspecified acquired deformity of left lower leg: Secondary | ICD-10-CM | POA: Diagnosis not present

## 2020-06-28 DIAGNOSIS — M792 Neuralgia and neuritis, unspecified: Secondary | ICD-10-CM | POA: Diagnosis not present

## 2020-07-25 DIAGNOSIS — R030 Elevated blood-pressure reading, without diagnosis of hypertension: Secondary | ICD-10-CM | POA: Diagnosis not present

## 2020-07-25 DIAGNOSIS — M810 Age-related osteoporosis without current pathological fracture: Secondary | ICD-10-CM | POA: Diagnosis not present

## 2020-08-22 DIAGNOSIS — L602 Onychogryphosis: Secondary | ICD-10-CM | POA: Diagnosis not present

## 2020-08-22 DIAGNOSIS — M21962 Unspecified acquired deformity of left lower leg: Secondary | ICD-10-CM | POA: Diagnosis not present

## 2020-08-22 DIAGNOSIS — M205X2 Other deformities of toe(s) (acquired), left foot: Secondary | ICD-10-CM | POA: Diagnosis not present

## 2020-08-22 DIAGNOSIS — M24571 Contracture, right ankle: Secondary | ICD-10-CM | POA: Diagnosis not present

## 2020-08-22 DIAGNOSIS — I70293 Other atherosclerosis of native arteries of extremities, bilateral legs: Secondary | ICD-10-CM | POA: Diagnosis not present

## 2020-08-22 DIAGNOSIS — L84 Corns and callosities: Secondary | ICD-10-CM | POA: Diagnosis not present

## 2020-08-23 DIAGNOSIS — M65312 Trigger thumb, left thumb: Secondary | ICD-10-CM | POA: Diagnosis not present

## 2020-08-23 DIAGNOSIS — M545 Low back pain, unspecified: Secondary | ICD-10-CM | POA: Diagnosis not present

## 2020-10-04 DIAGNOSIS — M24574 Contracture, right foot: Secondary | ICD-10-CM | POA: Diagnosis not present

## 2020-10-09 HISTORY — PX: HAMMER TOE SURGERY: SHX385

## 2020-10-30 DIAGNOSIS — E210 Primary hyperparathyroidism: Secondary | ICD-10-CM | POA: Diagnosis not present

## 2020-10-30 DIAGNOSIS — E042 Nontoxic multinodular goiter: Secondary | ICD-10-CM | POA: Diagnosis not present

## 2020-10-30 DIAGNOSIS — M810 Age-related osteoporosis without current pathological fracture: Secondary | ICD-10-CM | POA: Diagnosis not present

## 2020-11-07 DIAGNOSIS — E010 Iodine-deficiency related diffuse (endemic) goiter: Secondary | ICD-10-CM | POA: Diagnosis not present

## 2020-11-07 DIAGNOSIS — E042 Nontoxic multinodular goiter: Secondary | ICD-10-CM | POA: Diagnosis not present

## 2020-11-14 DIAGNOSIS — I70293 Other atherosclerosis of native arteries of extremities, bilateral legs: Secondary | ICD-10-CM | POA: Diagnosis not present

## 2020-11-14 DIAGNOSIS — L602 Onychogryphosis: Secondary | ICD-10-CM | POA: Diagnosis not present

## 2020-12-20 DIAGNOSIS — H401232 Low-tension glaucoma, bilateral, moderate stage: Secondary | ICD-10-CM | POA: Diagnosis not present

## 2021-01-12 ENCOUNTER — Ambulatory Visit
Admission: EM | Admit: 2021-01-12 | Discharge: 2021-01-12 | Disposition: A | Discharge status: Home / Self Care | Payer: Medicare PPO

## 2021-01-12 ENCOUNTER — Other Ambulatory Visit: Payer: Self-pay

## 2021-01-12 DIAGNOSIS — R030 Elevated blood-pressure reading, without diagnosis of hypertension: Secondary | ICD-10-CM

## 2021-01-12 NOTE — ED Provider Notes (Signed)
[facility]    CSN: [id number] Arrival date & time: 01/12/21  0858      History   Chief Complaint Chief Complaint  Patient presents with   Hypertension    HPI Paige Ross is a 80 y.o. female history of glaucoma presenting today for evaluation of elevated blood pressure.  Patient regularly checks her blood pressure and notes that over the past 2 days has been elevated.  Reports readings of 170-180 systolic and around 100 diastolic over the past 2 days.  Earlier this week readings were 125-130 systolic over 80 diastolic.  She denies any symptoms of headache, vision changes, dizziness, lightheadedness, chest pain or shortness of breath.  Denies prior problems with blood pressure.  Denies history of diabetes.  Denies tobacco use.  Does report some hyperlipidemia.  HPI  Past Medical History:  Diagnosis Date   Glaucoma     Patient Active Problem List   Diagnosis Date Noted   Glaucoma     Past Surgical History:  Procedure Laterality Date   ABDOMINAL HYSTERECTOMY     BUNIONECTOMY     EYE SURGERY      OB History   No obstetric history on file.      Home Medications    Prior to Admission medications   Medication Sig Start Date End Date Taking? Authorizing Provider  brimonidine-timolol (COMBIGAN) 0.2-0.5 % ophthalmic solution Place 1 drop into both eyes every 12 (twelve) hours.    [provider]  latanoprost (XALATAN) 0.005 % ophthalmic solution 1 drop at bedtime.    [provider]  vitamin C (ASCORBIC ACID) 500 MG tablet Take 500 mg by mouth 4 (four) times a week.    [provider]  Vitamin D, Cholecalciferol, 1000 UNITS TABS Take 1 tablet by mouth daily. Pt takes 2,000 mg for 5 days (Monday thru Friday)    [provider]    Family History Family History  Problem Relation Age of Onset   Heart disease Mother     Social History Social History   Tobacco Use   Smoking status: Never   Smokeless tobacco:  Never  Substance Use Topics   Alcohol use: No    Alcohol/week: 0.0 standard drinks   Drug use: No     Allergies   Patient has no known allergies.   Review of Systems Review of Systems  Constitutional:  Negative for fatigue and fever.  HENT:  Negative for congestion, sinus pressure and sore throat.   Eyes:  Negative for photophobia, pain and visual disturbance.  Respiratory:  Negative for cough and shortness of breath.   Cardiovascular:  Negative for chest pain.  Gastrointestinal:  Negative for abdominal pain, nausea and vomiting.  Genitourinary:  Negative for decreased urine volume and hematuria.  Musculoskeletal:  Negative for myalgias, neck pain and neck stiffness.  Neurological:  Negative for dizziness, syncope, facial asymmetry, speech difficulty, weakness, light-headedness, numbness and headaches.    Physical Exam Triage Vital Signs ED Triage Vitals  Enc Vitals Group     BP      Pulse      Resp      Temp      Temp src      SpO2      Weight      Height      Head Circumference      Peak Flow      Pain Score      Pain Loc      Pain Edu?  Excl. in GC?    No data found.  Updated Vital Signs BP (!) 169/93 (BP Location: Left Arm)   Pulse (!) 50   Temp 97.9 F (36.6 C) (Oral)   Resp 18   SpO2 100%   Visual Acuity Right Eye Distance:   Left Eye Distance:   Bilateral Distance:    Right Eye Near:   Left Eye Near:    Bilateral Near:     Physical Exam Vitals and nursing note reviewed.  Constitutional:      Appearance: She is well-developed.     Comments: No acute distress  HENT:     Head: Normocephalic and atraumatic.     Nose: Nose normal.  Eyes:     Extraocular Movements: Extraocular movements intact.     Conjunctiva/sclera: Conjunctivae normal.     Pupils: Pupils are equal, round, and reactive to light.  Cardiovascular:     Rate and Rhythm: Normal rate and regular rhythm.  Pulmonary:     Effort: Pulmonary effort is normal. No respiratory  distress.     Comments: Breathing comfortably at rest, CTABL, no wheezing, rales or other adventitious sounds auscultated  Abdominal:     General: There is no distension.  Musculoskeletal:        General: Normal range of motion.     Cervical back: Neck supple.  Skin:    General: Skin is warm and dry.  Neurological:     General: No focal deficit present.     Mental Status: She is alert and oriented to person, place, and time. Mental status is at baseline.     Cranial Nerves: No cranial nerve deficit.     Motor: No weakness.     Gait: Gait normal.     UC Treatments / Results  Labs (all labs ordered are listed, but only abnormal results are displayed) Labs Reviewed - No data to display  EKG   Radiology No results found.  Procedures Procedures (including critical care time)  Medications Ordered in UC Medications - No data to display  Initial Impression / Assessment and Plan / UC Course  I have reviewed the triage vital signs and the nursing notes.  Pertinent labs & imaging results that were available during my care of the patient were reviewed by me and considered in my medical decision making (see chart for details).  Clinical Course as of 01/12/21 1106  Sat Jan 12, 2021  1038 157/93 [HW]    Clinical Course User Index [HW] Lew Dawes, [place]    80 year old female with elevated blood pressure reading, asymptomatic.  On recheck 157/93.  With shared decision-making with patient opted to continue to monitor readings as she previously had normal readings earlier in the week.  Deferring initiating medicines.  Encouraged recording at home readings over the next 2 weeks and follow-up with primary care or here for blood pressure recheck.  Discussed warning signs to follow-up for as well.  We will proceed with lifestyle changes. Discussed strict return precautions. Patient verbalized understanding and is agreeable with plan.   Final Clinical Impressions(s) / UC Diagnoses    Final diagnoses:  Elevated blood pressure reading without diagnosis of hypertension     Discharge Instructions      Continue to monitor blood pressure in the morning and evening over the next 1 to 2 weeks Follow-up with [place] or your primary care for blood pressure recheck Incorporate activity, avoid salt, read about DASH diet-see attached Drink plenty of water Please go to  emergency room if you are developing any headache, vision changes, difficulty speaking, one-sided weakness, chest pain     ED Prescriptions   None    PDMP not reviewed this encounter.   Lew Dawes, PA-C 01/12/21 1106

## 2021-01-12 NOTE — ED Triage Notes (Signed)
Pt presents today for a BP after monitoring it at home and getting high readings. Pt checked BP three days ago 125/79, yesterday afternoon 181/106 and this morning around 7am 178/102  No h/o hypertension. Denies HA, sob and chest pain.

## 2021-01-12 NOTE — Discharge Instructions (Addendum)
Continue to monitor blood pressure in the morning and evening over the next 1 to 2 weeks Follow-up with [place] or your primary care for blood pressure recheck Incorporate activity, avoid salt, read about DASH diet-see attached Drink plenty of water Please go to emergency room if you are developing any headache, vision changes, difficulty speaking, one-sided weakness, chest pain

## 2021-01-30 DIAGNOSIS — I70293 Other atherosclerosis of native arteries of extremities, bilateral legs: Secondary | ICD-10-CM | POA: Diagnosis not present

## 2021-01-30 DIAGNOSIS — L84 Corns and callosities: Secondary | ICD-10-CM | POA: Diagnosis not present

## 2021-01-30 DIAGNOSIS — L602 Onychogryphosis: Secondary | ICD-10-CM | POA: Diagnosis not present

## 2021-01-31 DIAGNOSIS — Z1231 Encounter for screening mammogram for malignant neoplasm of breast: Secondary | ICD-10-CM | POA: Diagnosis not present

## 2021-02-07 DIAGNOSIS — I10 Essential (primary) hypertension: Secondary | ICD-10-CM | POA: Diagnosis not present

## 2021-02-19 DIAGNOSIS — E7800 Pure hypercholesterolemia, unspecified: Secondary | ICD-10-CM | POA: Diagnosis not present

## 2021-02-19 DIAGNOSIS — I10 Essential (primary) hypertension: Secondary | ICD-10-CM | POA: Diagnosis not present

## 2021-02-19 DIAGNOSIS — E210 Primary hyperparathyroidism: Secondary | ICD-10-CM | POA: Diagnosis not present

## 2021-02-19 DIAGNOSIS — Z1389 Encounter for screening for other disorder: Secondary | ICD-10-CM | POA: Diagnosis not present

## 2021-02-19 DIAGNOSIS — Z0000 Encounter for general adult medical examination without abnormal findings: Secondary | ICD-10-CM | POA: Diagnosis not present

## 2021-02-19 DIAGNOSIS — M810 Age-related osteoporosis without current pathological fracture: Secondary | ICD-10-CM | POA: Diagnosis not present

## 2021-02-22 ENCOUNTER — Other Ambulatory Visit: Payer: Self-pay | Admitting: Internal Medicine

## 2021-02-22 DIAGNOSIS — M810 Age-related osteoporosis without current pathological fracture: Secondary | ICD-10-CM

## 2021-03-11 DIAGNOSIS — R030 Elevated blood-pressure reading, without diagnosis of hypertension: Secondary | ICD-10-CM | POA: Diagnosis not present

## 2021-03-12 DIAGNOSIS — M65312 Trigger thumb, left thumb: Secondary | ICD-10-CM | POA: Diagnosis not present

## 2021-03-12 DIAGNOSIS — M7062 Trochanteric bursitis, left hip: Secondary | ICD-10-CM | POA: Diagnosis not present

## 2021-03-22 DIAGNOSIS — M810 Age-related osteoporosis without current pathological fracture: Secondary | ICD-10-CM | POA: Diagnosis not present

## 2021-03-22 DIAGNOSIS — I10 Essential (primary) hypertension: Secondary | ICD-10-CM | POA: Diagnosis not present

## 2021-04-17 DIAGNOSIS — Z961 Presence of intraocular lens: Secondary | ICD-10-CM | POA: Diagnosis not present

## 2021-04-17 DIAGNOSIS — H401232 Low-tension glaucoma, bilateral, moderate stage: Secondary | ICD-10-CM | POA: Diagnosis not present

## 2021-04-25 DIAGNOSIS — M810 Age-related osteoporosis without current pathological fracture: Secondary | ICD-10-CM | POA: Diagnosis not present

## 2021-04-25 DIAGNOSIS — M8588 Other specified disorders of bone density and structure, other site: Secondary | ICD-10-CM | POA: Diagnosis not present

## 2021-05-08 DIAGNOSIS — L84 Corns and callosities: Secondary | ICD-10-CM | POA: Diagnosis not present

## 2021-05-08 DIAGNOSIS — M24571 Contracture, right ankle: Secondary | ICD-10-CM | POA: Diagnosis not present

## 2021-05-08 DIAGNOSIS — M205X1 Other deformities of toe(s) (acquired), right foot: Secondary | ICD-10-CM | POA: Diagnosis not present

## 2021-05-08 DIAGNOSIS — M205X2 Other deformities of toe(s) (acquired), left foot: Secondary | ICD-10-CM | POA: Diagnosis not present

## 2021-05-08 DIAGNOSIS — M21961 Unspecified acquired deformity of right lower leg: Secondary | ICD-10-CM | POA: Diagnosis not present

## 2021-05-08 DIAGNOSIS — L603 Nail dystrophy: Secondary | ICD-10-CM | POA: Diagnosis not present

## 2021-05-08 DIAGNOSIS — I70293 Other atherosclerosis of native arteries of extremities, bilateral legs: Secondary | ICD-10-CM | POA: Diagnosis not present

## 2021-05-08 DIAGNOSIS — M21962 Unspecified acquired deformity of left lower leg: Secondary | ICD-10-CM | POA: Diagnosis not present

## 2021-05-08 DIAGNOSIS — B351 Tinea unguium: Secondary | ICD-10-CM | POA: Diagnosis not present

## 2021-08-07 DIAGNOSIS — B351 Tinea unguium: Secondary | ICD-10-CM | POA: Diagnosis not present

## 2021-08-07 DIAGNOSIS — L84 Corns and callosities: Secondary | ICD-10-CM | POA: Diagnosis not present

## 2021-08-07 DIAGNOSIS — L603 Nail dystrophy: Secondary | ICD-10-CM | POA: Diagnosis not present

## 2021-08-07 DIAGNOSIS — I70293 Other atherosclerosis of native arteries of extremities, bilateral legs: Secondary | ICD-10-CM | POA: Diagnosis not present

## 2021-08-13 DIAGNOSIS — F419 Anxiety disorder, unspecified: Secondary | ICD-10-CM | POA: Diagnosis not present

## 2021-08-13 DIAGNOSIS — I10 Essential (primary) hypertension: Secondary | ICD-10-CM | POA: Diagnosis not present

## 2021-08-14 DIAGNOSIS — M24571 Contracture, right ankle: Secondary | ICD-10-CM | POA: Diagnosis not present

## 2021-08-14 DIAGNOSIS — L602 Onychogryphosis: Secondary | ICD-10-CM | POA: Diagnosis not present

## 2021-08-14 DIAGNOSIS — M205X2 Other deformities of toe(s) (acquired), left foot: Secondary | ICD-10-CM | POA: Diagnosis not present

## 2021-08-14 DIAGNOSIS — M792 Neuralgia and neuritis, unspecified: Secondary | ICD-10-CM | POA: Diagnosis not present

## 2021-08-14 DIAGNOSIS — M205X1 Other deformities of toe(s) (acquired), right foot: Secondary | ICD-10-CM | POA: Diagnosis not present

## 2021-08-14 DIAGNOSIS — M21962 Unspecified acquired deformity of left lower leg: Secondary | ICD-10-CM | POA: Diagnosis not present

## 2021-08-14 DIAGNOSIS — L84 Corns and callosities: Secondary | ICD-10-CM | POA: Diagnosis not present

## 2021-08-14 DIAGNOSIS — L603 Nail dystrophy: Secondary | ICD-10-CM | POA: Diagnosis not present

## 2021-08-14 DIAGNOSIS — M21961 Unspecified acquired deformity of right lower leg: Secondary | ICD-10-CM | POA: Diagnosis not present

## 2021-09-03 ENCOUNTER — Ambulatory Visit: Payer: Medicare PPO | Admitting: Podiatry

## 2021-09-03 ENCOUNTER — Other Ambulatory Visit: Payer: Self-pay

## 2021-09-03 DIAGNOSIS — M2042 Other hammer toe(s) (acquired), left foot: Secondary | ICD-10-CM | POA: Diagnosis not present

## 2021-09-03 DIAGNOSIS — M2041 Other hammer toe(s) (acquired), right foot: Secondary | ICD-10-CM | POA: Diagnosis not present

## 2021-09-03 DIAGNOSIS — Q828 Other specified congenital malformations of skin: Secondary | ICD-10-CM | POA: Diagnosis not present

## 2021-09-03 DIAGNOSIS — M79674 Pain in right toe(s): Secondary | ICD-10-CM | POA: Diagnosis not present

## 2021-09-08 ENCOUNTER — Encounter: Payer: Self-pay | Admitting: Podiatry

## 2021-09-08 NOTE — Progress Notes (Signed)
Subjective: Paige Ross presents today for evaluation. She is requesting second opinion regarding painful right 5th digit. She states she wore boots on Friday and right 5th digit was swollen and tender a day or so later. She was evaluated by Dr. Fritzi Mandes who recommended surgery for right 5th digit hammertoe.   Patient has h/o multiple foot surgeries of right foot:  1989 Bunionectomy right foot 2005 Bunionectomy Dr. Caffie Pinto 2008 Hammertoe repair right 3rd digit by Dr. Sharol Given 2013  Hammertoe repair Dr. Paulla Dolly March 2022 Hammertoe repair right 3rd digit by Dr. Fritzi Mandes  Past Medical History:  Diagnosis Date   Glaucoma      Patient Active Problem List   Diagnosis Date Noted   Laryngopharyngeal reflux (LPR) 02/23/2020   Post-nasal drainage 02/23/2020   Allergies 11/09/2019   Right ear impacted cerumen 11/09/2019   Glaucoma     Current Outpatient Medications on File Prior to Visit  Medication Sig Dispense Refill   brimonidine-timolol (COMBIGAN) 0.2-0.5 % ophthalmic solution Place 1 drop into both eyes every 12 (twelve) hours.     latanoprost (XALATAN) 0.005 % ophthalmic solution 1 drop at bedtime.     vitamin C (ASCORBIC ACID) 500 MG tablet Take 500 mg by mouth 4 (four) times a week.     Vitamin D, Cholecalciferol, 1000 UNITS TABS Take 1 tablet by mouth daily. Pt takes 2,000 mg for 5 days (Monday thru Friday)     No current facility-administered medications on file prior to visit.     No Known Allergies   Social History   Occupational History   Not on file  Tobacco Use   Smoking status: Never   Smokeless tobacco: Never  Substance and Sexual Activity   Alcohol use: No    Alcohol/week: 0.0 standard drinks   Drug use: No   Sexual activity: Not on file     Family History  Problem Relation Age of Onset   Heart disease Mother      Immunization History  Administered Date(s) Administered   Influenza Split 05/19/2012   Influenza,inj,Quad PF,6+ Mos 06/13/2013, 06/09/2014,  06/11/2015, 04/22/2016     Objective: Paige Ross is a pleasant 81 y.o. female WD, WN in NAD. AAO x 3.  There were no vitals filed for this visit.  Vascular Examination:  Capillary refill time to digits immediate b/l. Palpable DP pulse(s) b/l LE. Palpable PT pulse(s) b/l LE. Pedal hair sparse. No pain with calf compression b/l. Lower extremity skin temperature gradient within normal limits. Trace edema noted bilateral ankles. No cyanosis or clubbing noted b/l LE.  Dermatological Examination: Well healed surgical scars noted 1st metatarsal right foot, and digits 2, 3, and 4 right foot. No open wounds b/l LE. No interdigital macerations noted b/l LE. Porokeratotic lesion(s) R 5th toe. No erythema, no edema, no drainage, no fluctuance.  Musculoskeletal: Normal muscle strength 5/5 to all lower extremity muscle groups bilaterally. Hammertoe(s) noted to the R 5th toe.Marland Kitchen No pain, crepitus or joint limitation noted with ROM b/l LE.  Patient ambulates independently without assistive aids.  Neurological: Protective sensation intact 5/5 intact bilaterally with 10g monofilament b/l. Vibratory sensation intact b/l.   Assessment: 1. Porokeratosis   2. Acquired hammertoes of both feet   3. Pain in right toe(s)     Plan: -Examined patient. -Discussed porokeratosis and hammertoe deformity right 5th digit. Conservative treatment includes padding of digit and routine paring of lesion of right 5th toe. Surgical treatment would be a more definitive alternative. -Painful porokeratotic lesion(s) R  5th toe pared and enucleated with sterile scalpel blade without incident. Total number of lesions debrided=1. -Dispensed Silipos toe cap. Apply to R 5th toe every morning. Remove every evening. -Patient/POA to call should there be question/concern in the interim.  Return if symptoms worsen or fail to improve.  Paige Ross, DPM

## 2021-10-21 DIAGNOSIS — H401232 Low-tension glaucoma, bilateral, moderate stage: Secondary | ICD-10-CM | POA: Diagnosis not present

## 2021-11-13 ENCOUNTER — Ambulatory Visit: Payer: Medicare PPO | Admitting: Podiatry

## 2021-12-02 ENCOUNTER — Ambulatory Visit: Payer: Medicare PPO | Admitting: Podiatry

## 2021-12-02 ENCOUNTER — Encounter: Payer: Self-pay | Admitting: Podiatry

## 2021-12-02 DIAGNOSIS — B351 Tinea unguium: Secondary | ICD-10-CM | POA: Diagnosis not present

## 2021-12-02 DIAGNOSIS — M79675 Pain in left toe(s): Secondary | ICD-10-CM | POA: Diagnosis not present

## 2021-12-02 DIAGNOSIS — M79674 Pain in right toe(s): Secondary | ICD-10-CM

## 2021-12-02 DIAGNOSIS — Q828 Other specified congenital malformations of skin: Secondary | ICD-10-CM | POA: Diagnosis not present

## 2021-12-08 NOTE — Progress Notes (Signed)
  Subjective:  Patient ID: Paige Ross, female    DOB: 01-16-1941,  MRN: [id number]  Paige Ross presents to clinic today for painful porokeratotic lesions R 5th toe. Pain prevent(s) comfortable ambulation. Aggravating factor is weightbearing with and without shoegear.  New problem(s): None.   PCP is Renford Dills, MD , and last visit was February 22, 2021.  No Known Allergies  Review of Systems: Negative except as noted in the HPI.  Objective: No changes noted in today's physical examination. Paige Ross is a pleasant 81 y.o. female WD, WN in NAD. AAO x 3.  There were no vitals filed for this visit.  Vascular Examination:  Capillary refill time to digits immediate b/l. Palpable DP pulse(s) b/l LE. Palpable PT pulse(s) b/l LE. Pedal hair sparse. No pain with calf compression b/l. Lower extremity skin temperature gradient within normal limits. Trace edema noted bilateral ankles. No cyanosis or clubbing noted b/l LE. Toenails 1-5 b/l are painful, elongated, discolored, dystrophic with subungual debris. Pain with dorsal palpation of nailplates. No erythema, no edema, no drainage noted.   Dermatological Examination: Well healed surgical scars noted 1st metatarsal right foot, and digits 2, 3, and 4 right foot. No open wounds b/l LE. No interdigital macerations noted b/l LE. Minimal hyperkeratosis submet head 1 right foot. Porokeratotic lesion(s) R 5th toe. No erythema, no edema, no drainage, no fluctuance.  Musculoskeletal: Normal muscle strength 5/5 to all lower extremity muscle groups bilaterally. Hammertoe(s) noted to the R 5th toe.Marland Kitchen No pain, crepitus or joint limitation noted with ROM b/l LE.  Patient ambulates independently without assistive aids.  Neurological: Protective sensation intact 5/5 intact bilaterally with 10g monofilament b/l. Vibratory sensation intact b/l.  Assessment/Plan: 1. Pain due to onychomycosis of toenails of both feet   2. Porokeratosis   3.  Pain in right toe(s)     -Patient was evaluated and treated. All patient's and/or POA's questions/concerns answered on today's visit. -Medicare ABN on file for paring of porokeratotic lesion right 5th toe. -Toenails 1-5 b/l were debrided in length and girth with sterile nail nippers and dremel without iatrogenic bleeding.  -Porokeratotic lesion(s) R 5th toe pared and enucleated with sterile scalpel blade without incident. Total number of lesions debrided=1. -As a courtesy, HKT lesion filed without incident. -Patient/POA to call should there be question/concern in the interim.   Return in 3 months (on 03/04/2022), or if symptoms worsen or fail to improve.  Freddie Breech, DPM

## 2022-01-28 IMAGING — US US CAROTID DUPLEX BILAT
1 series · 13 of 24 positions shown · non-contrast
Comparison: None.

CLINICAL DATA: Pulsatile neck mass

EXAM:
BILATERAL CAROTID DUPLEX ULTRASOUND
TECHNIQUE: Gray scale imaging, color Doppler and duplex ultrasound were
performed of bilateral carotid and vertebral arteries in the neck.

[Series 1: us carotid duplex bilat · 0.06mm/px · 13 of 45 slices shown]
[im 1/45]
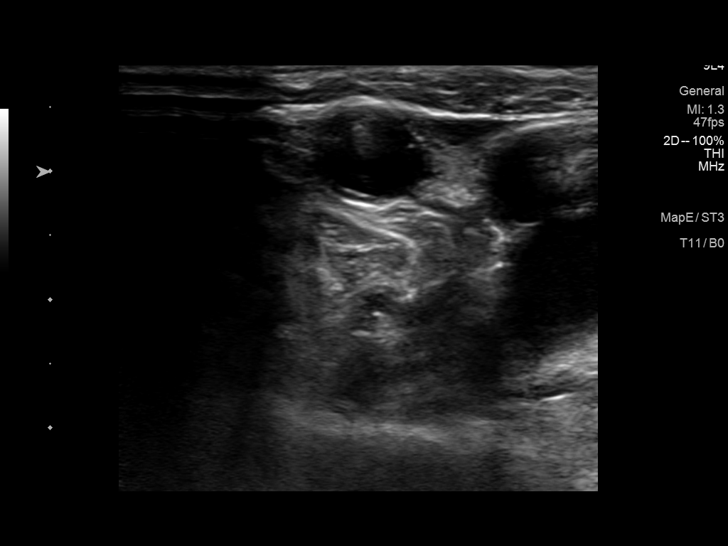
[im 4/45]
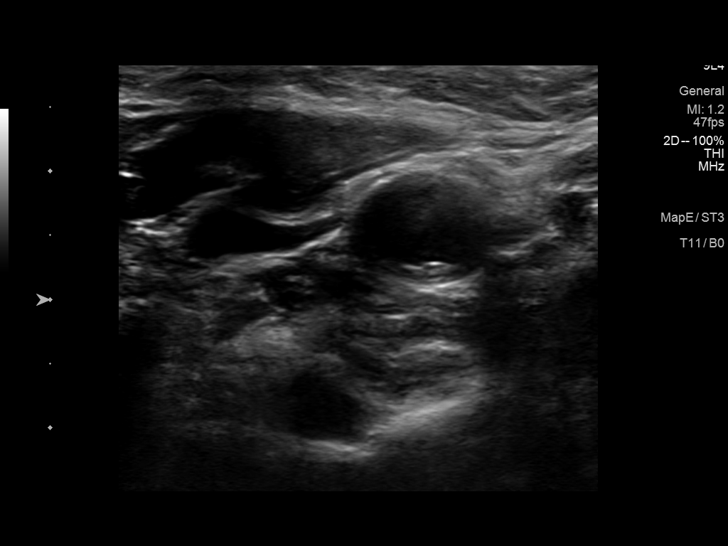
[im 8/45]
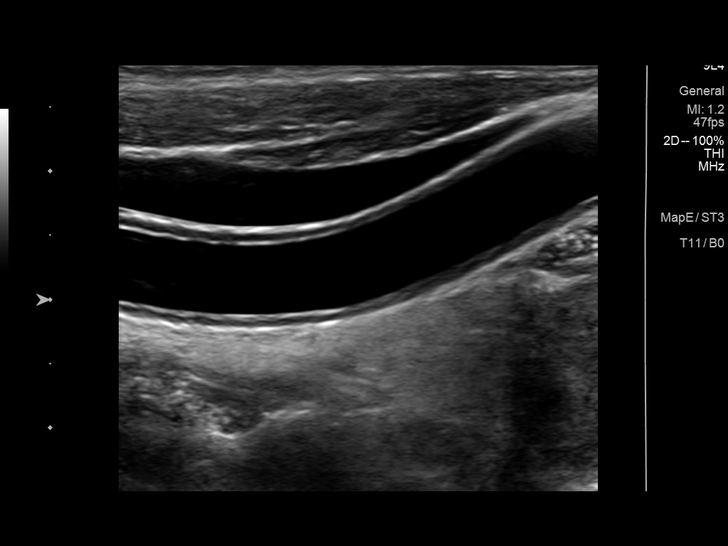
[im 12/45]
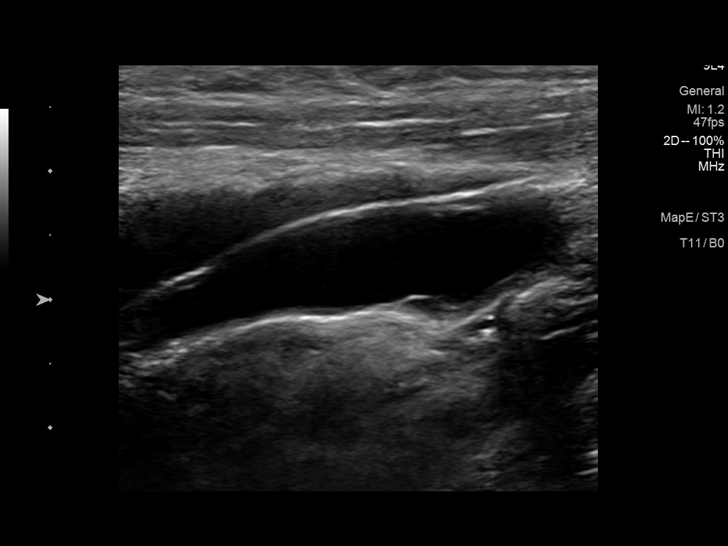
[im 16/45]
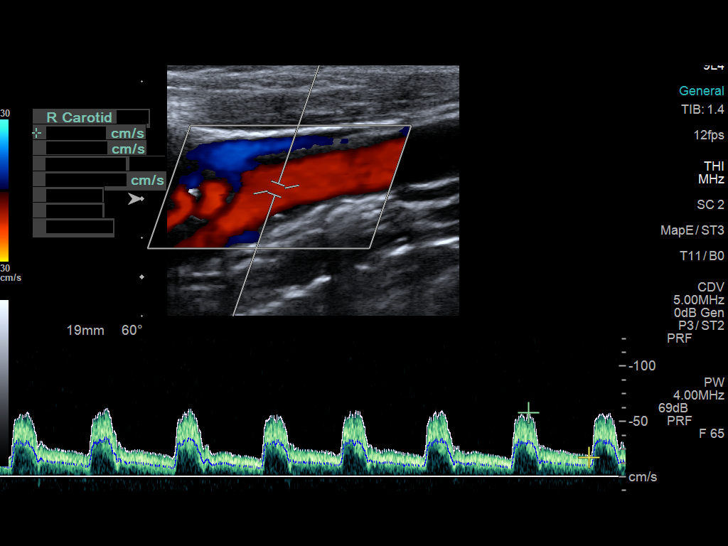
[im 20/45]
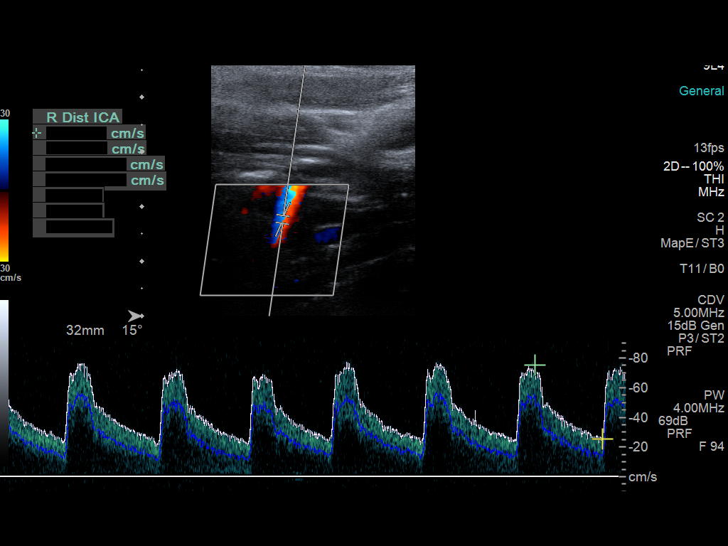
[im 23/45]
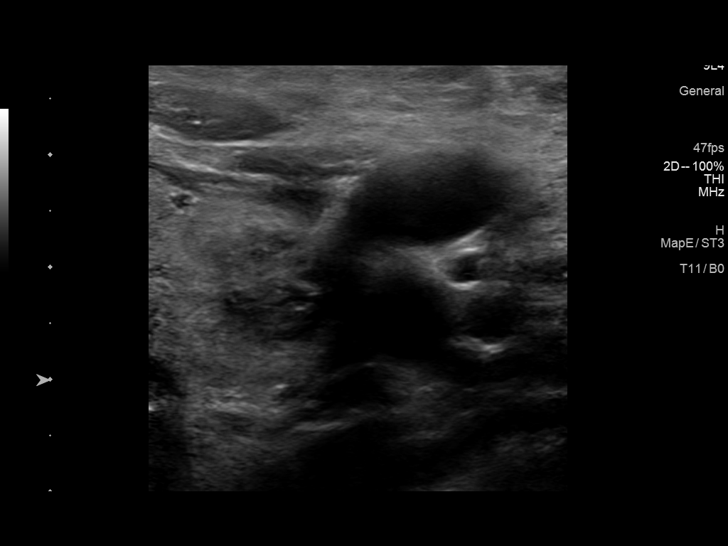
[im 25/45]
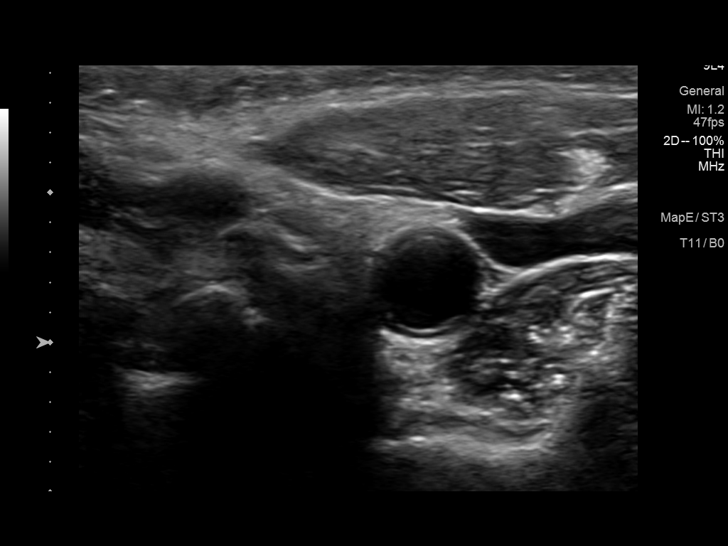
[im 29/45]
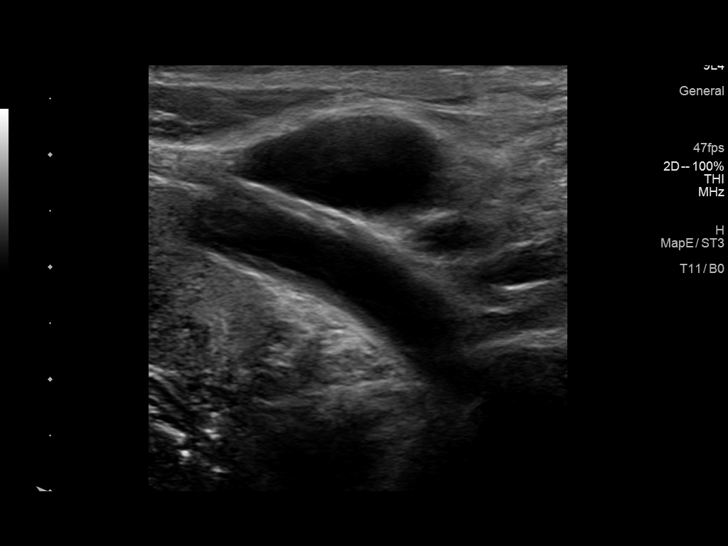
[im 33/45]
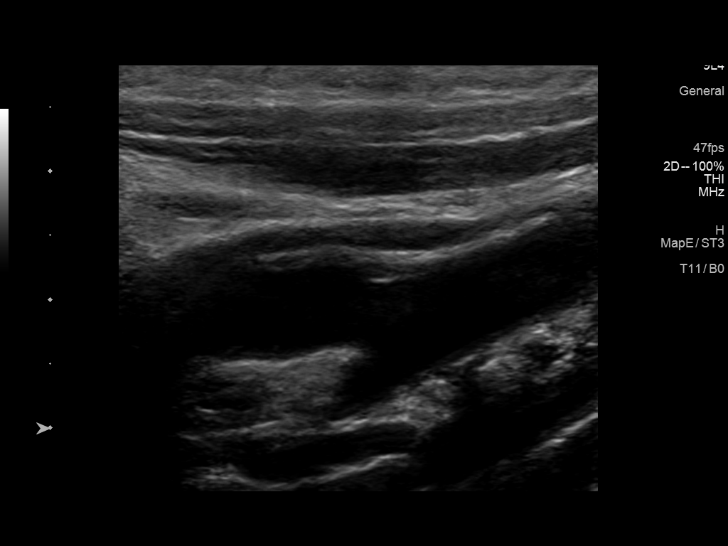
[im 37/45]
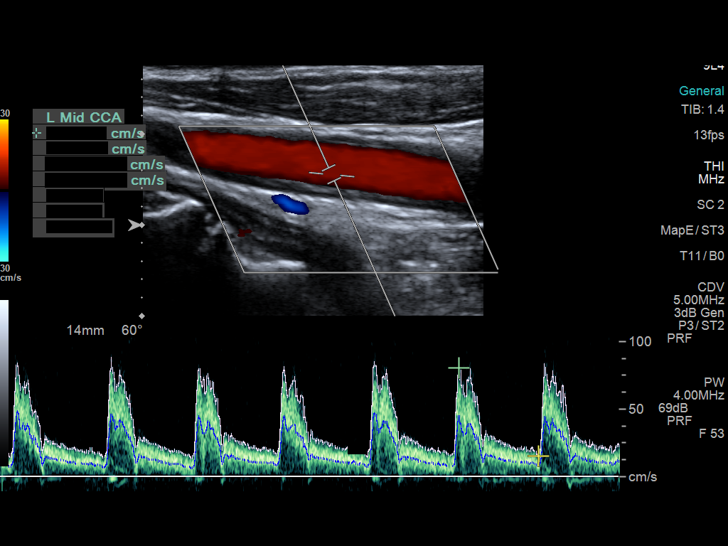
[im 41/45]
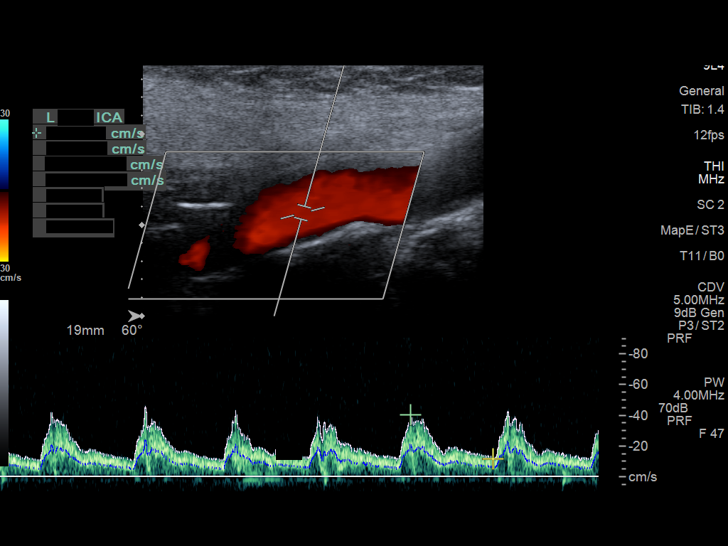
[im 45/45]
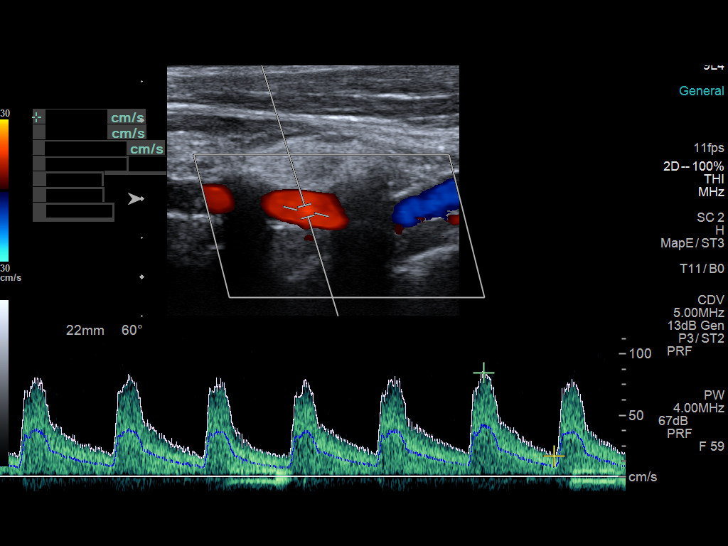

[13 of 24 positions shown; findings below may reference images not displayed]

FINDINGS: Criteria: Quantification of carotid stenosis is based on velocity
parameters that correlate the residual internal carotid diameter
with NASCET-based stenosis levels, using the diameter of the distal
internal carotid lumen as the denominator for stenosis measurement.

The following velocity measurements were obtained:

RIGHT

ICA: 98/35 cm/sec

CCA: 133/18 cm/sec

SYSTOLIC ICA/CCA RATIO:

ECA: 52 cm/sec

LEFT

ICA: 84/32 cm/sec

CCA: 129/23 cm/sec

SYSTOLIC ICA/CCA RATIO:

ECA: 60 cm/sec

RIGHT CAROTID ARTERY: Minor echogenic shadowing plaque formation. No
hemodynamically significant right ICA stenosis, velocity elevation,
or turbulent flow. Degree of narrowing less than 50%.

RIGHT VERTEBRAL ARTERY:  Antegrade

LEFT CAROTID ARTERY: Similar scattered minor echogenic plaque
formation. No hemodynamically significant left ICA stenosis,
velocity elevation, or turbulent flow.

LEFT VERTEBRAL ARTERY:  Antegrade

Upper extremity blood pressures: RIGHT: 139/65 LEFT: 142/50
IMPRESSION: Minor carotid atherosclerosis. No hemodynamically significant ICA
stenosis. Degree of narrowing less than 50% bilaterally by
ultrasound criteria.

Patent antegrade vertebral flow bilaterally

## 2022-02-04 DIAGNOSIS — Z1231 Encounter for screening mammogram for malignant neoplasm of breast: Secondary | ICD-10-CM | POA: Diagnosis not present

## 2022-03-11 ENCOUNTER — Ambulatory Visit: Payer: Medicare PPO | Admitting: Podiatry

## 2022-03-11 ENCOUNTER — Encounter: Payer: Self-pay | Admitting: Podiatry

## 2022-03-11 DIAGNOSIS — M79674 Pain in right toe(s): Secondary | ICD-10-CM | POA: Diagnosis not present

## 2022-03-11 DIAGNOSIS — B351 Tinea unguium: Secondary | ICD-10-CM

## 2022-03-11 DIAGNOSIS — G479 Sleep disorder, unspecified: Secondary | ICD-10-CM | POA: Insufficient documentation

## 2022-03-11 DIAGNOSIS — E213 Hyperparathyroidism, unspecified: Secondary | ICD-10-CM | POA: Insufficient documentation

## 2022-03-11 DIAGNOSIS — Z860100 Personal history of colon polyps, unspecified: Secondary | ICD-10-CM | POA: Insufficient documentation

## 2022-03-11 DIAGNOSIS — F419 Anxiety disorder, unspecified: Secondary | ICD-10-CM | POA: Insufficient documentation

## 2022-03-11 DIAGNOSIS — Q828 Other specified congenital malformations of skin: Secondary | ICD-10-CM

## 2022-03-11 DIAGNOSIS — M79675 Pain in left toe(s): Secondary | ICD-10-CM

## 2022-03-11 DIAGNOSIS — M810 Age-related osteoporosis without current pathological fracture: Secondary | ICD-10-CM | POA: Insufficient documentation

## 2022-03-11 DIAGNOSIS — J302 Other seasonal allergic rhinitis: Secondary | ICD-10-CM | POA: Insufficient documentation

## 2022-03-11 DIAGNOSIS — E042 Nontoxic multinodular goiter: Secondary | ICD-10-CM | POA: Insufficient documentation

## 2022-03-11 DIAGNOSIS — L2081 Atopic neurodermatitis: Secondary | ICD-10-CM | POA: Insufficient documentation

## 2022-03-11 DIAGNOSIS — I10 Essential (primary) hypertension: Secondary | ICD-10-CM | POA: Insufficient documentation

## 2022-03-11 DIAGNOSIS — E7800 Pure hypercholesterolemia, unspecified: Secondary | ICD-10-CM | POA: Insufficient documentation

## 2022-03-18 NOTE — Progress Notes (Signed)
  Subjective:  Patient ID: Paige Ross, female    DOB: 1940/09/08,  MRN: [id number]  Jelisa D Garske presents to clinic today for painful porokeratotic lesion(s) b/l lower extremities and painful mycotic toenails that limit ambulation. Painful toenails interfere with ambulation. Aggravating factors include wearing enclosed shoe gear. Pain is relieved with periodic professional debridement. Painful porokeratotic lesions are aggravated when weightbearing with and without shoegear. Pain is relieved with periodic professional debridement.  PCP is Renford Dills, MD , and last visit was March, 2023.  No Known Allergies  Review of Systems: Negative except as noted in the HPI.  Objective: No changes noted in today's physical examination.  Vascular Examination: Palpable pedal pulses b/l LE. Digital hair present b/l. No pedal edema b/l. Skin temperature gradient WNL b/l. No varicosities b/l. Trace edema noted bilateral ankles. No cyanosis or clubbing noted b/l LE.Marland Kitchen  Dermatological Examination: Pedal skin with normal turgor, texture and tone b/l. No open wounds. No interdigital macerations b/l. Toenails 1-5 b/l thickened, discolored, dystrophic with subungual debris. There is pain on palpation to dorsal aspect of nailplates. Porokeratotic lesion(s) lateral DIPJ of L 5th toe, dorsal PIPJ of R 5th toe, R 4th toe, and submet head 1 right foot. No erythema, no edema, no drainage, no fluctuance..  Neurological Examination: Protective sensation intact with 10 gram monofilament b/l LE. Vibratory sensation intact b/l LE.   Musculoskeletal Examination: Muscle strength 5/5 to all LE muscle groups b/l. 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  Assessment/Plan: 1. Pain due to onychomycosis of toenails of both feet   2. Porokeratosis   3. Pain in right toe(s)     -Examined patient. -Medicare ABN on file for paring of lesions. -Mycotic toenails 1-5 bilaterally were debrided in length and girth  with sterile nail nippers and dremel without incident. -Porokeratotic lesion(s) bilateral 5th toes, R 4th toe, and submet head 1 right foot pared and enucleated with sterile scalpel blade without incident. Total number of lesions debrided=4. -Patient/POA to call should there be question/concern in the interim.   Return in about 3 months (around 06/11/2022).  Freddie Breech, DPM

## 2022-04-08 DIAGNOSIS — Z0000 Encounter for general adult medical examination without abnormal findings: Secondary | ICD-10-CM | POA: Diagnosis not present

## 2022-04-08 DIAGNOSIS — Z23 Encounter for immunization: Secondary | ICD-10-CM | POA: Diagnosis not present

## 2022-04-08 DIAGNOSIS — E213 Hyperparathyroidism, unspecified: Secondary | ICD-10-CM | POA: Diagnosis not present

## 2022-04-08 DIAGNOSIS — E7800 Pure hypercholesterolemia, unspecified: Secondary | ICD-10-CM | POA: Diagnosis not present

## 2022-04-08 DIAGNOSIS — M810 Age-related osteoporosis without current pathological fracture: Secondary | ICD-10-CM | POA: Diagnosis not present

## 2022-04-08 DIAGNOSIS — Z1331 Encounter for screening for depression: Secondary | ICD-10-CM | POA: Diagnosis not present

## 2022-04-08 DIAGNOSIS — Z79899 Other long term (current) drug therapy: Secondary | ICD-10-CM | POA: Diagnosis not present

## 2022-04-08 DIAGNOSIS — I10 Essential (primary) hypertension: Secondary | ICD-10-CM | POA: Diagnosis not present

## 2022-04-08 DIAGNOSIS — Z5181 Encounter for therapeutic drug level monitoring: Secondary | ICD-10-CM | POA: Diagnosis not present

## 2022-04-09 IMAGING — US US FNA BIOPSY THYROID 1ST LESION
1 series · 13 of 20 positions shown · non-contrast
Comparison: Thyroid ultrasound dated 06/14/2019

MEDICATIONS:
None

COMPLICATIONS:
None immediate.

INDICATION: Patient with history of thyroid nodules and previous benign right
thyroid nodule biopsies in 7337 and 3669. Follow-up thyroid
ultrasound on 06/14/2019 revealed a 2 cm right isthmus nodule which
meets criteria for biopsy. She presents today for the procedure.

EXAM:
ULTRASOUND GUIDED FINE NEEDLE ASPIRATION BIOPSY OF RIGHT THYROID
ISTHMUS NODULE
TECHNIQUE: Informed written consent was obtained from the patient after a
discussion of the risks, benefits and alternatives to treatment.
Questions regarding the procedure were encouraged and answered. A
timeout was performed prior to the initiation of the procedure.

[Series 1: us fna biopsy thyroid 1st lesion · 0.06mm/px · 20 acquisitions, 13 frames shown]
[im 1/20]
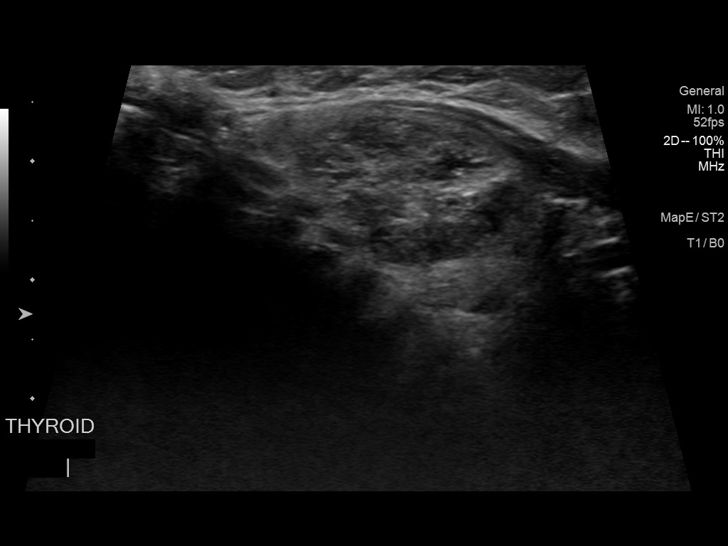
[im 3/20]
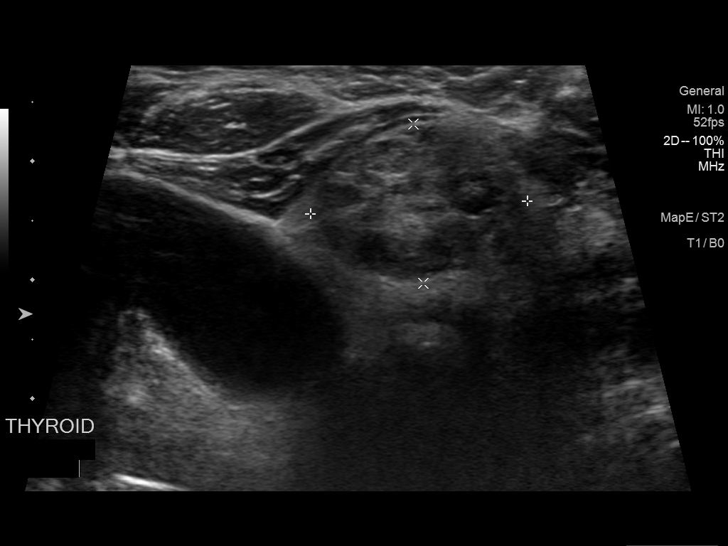
[im 4/20]
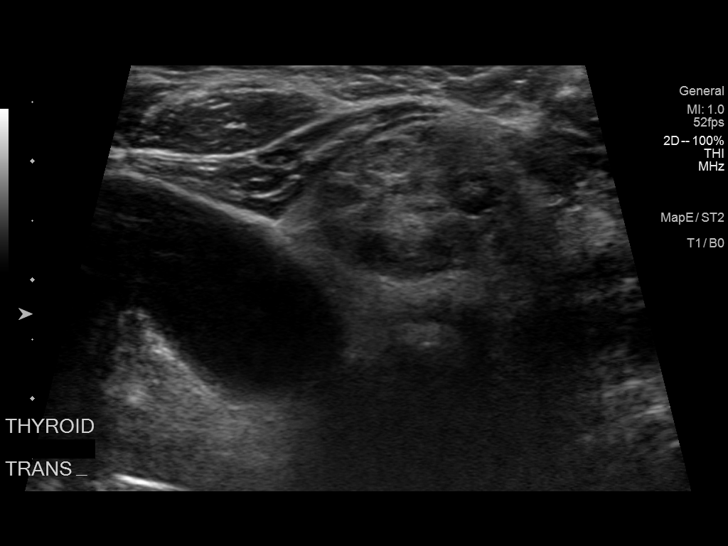
[im 6/20]
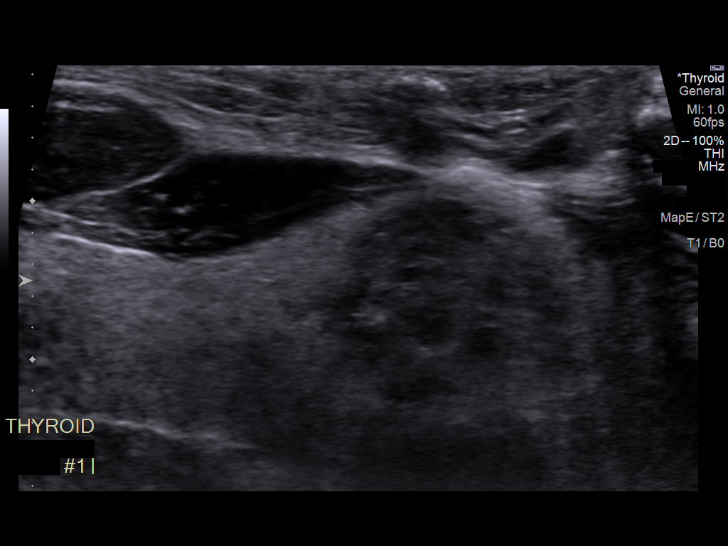
[im 7/20]
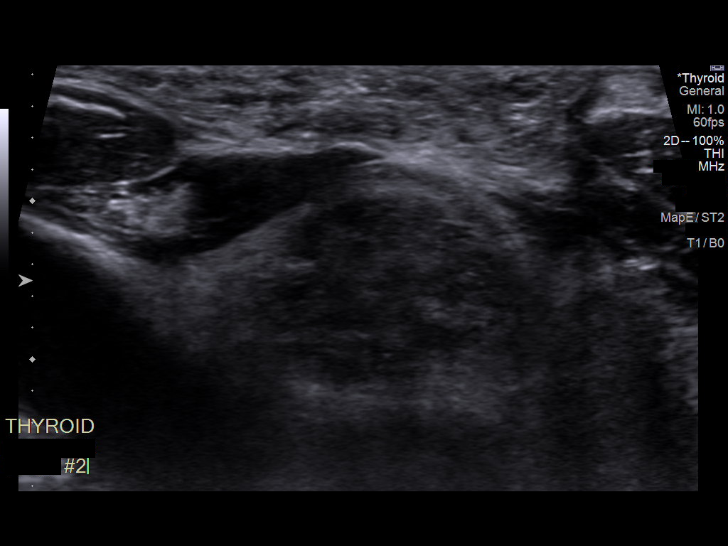
[im 9/20]
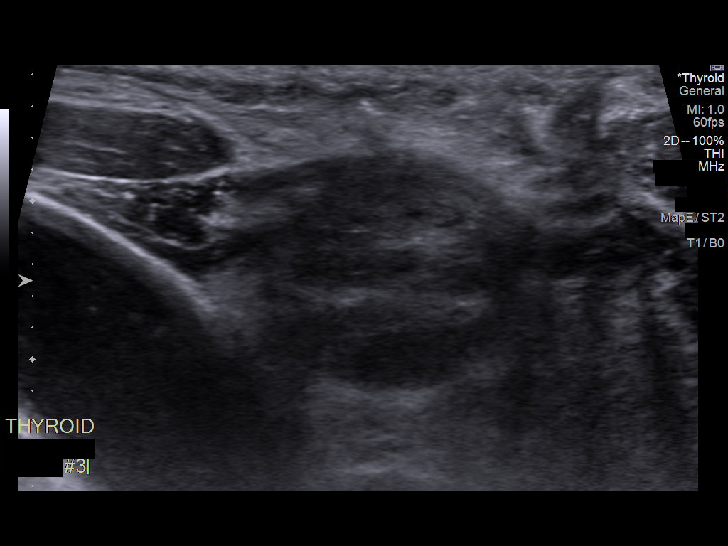
[im 11/20]
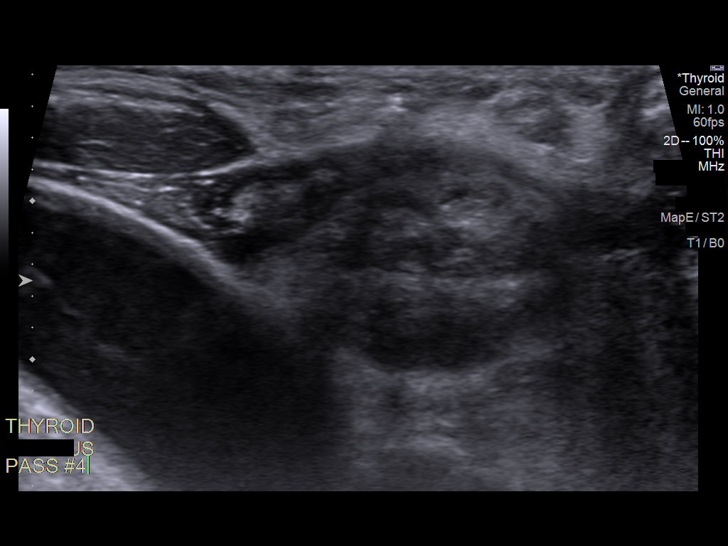
[im 12/20]
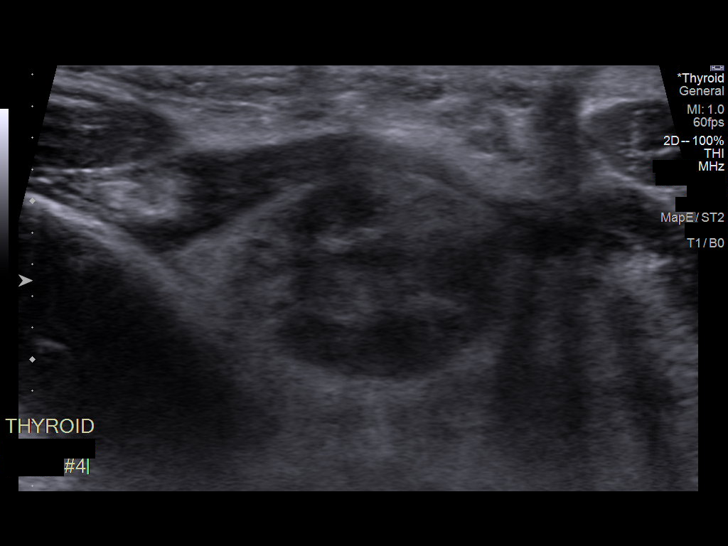
[im 14/20]
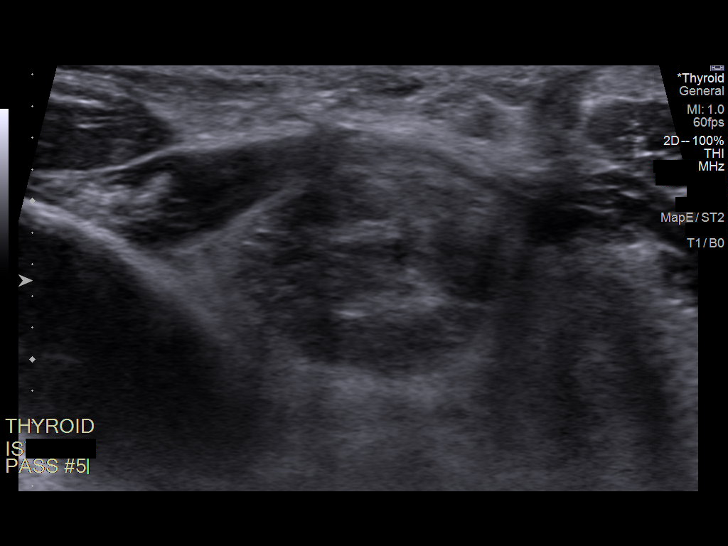
[im 15/20]
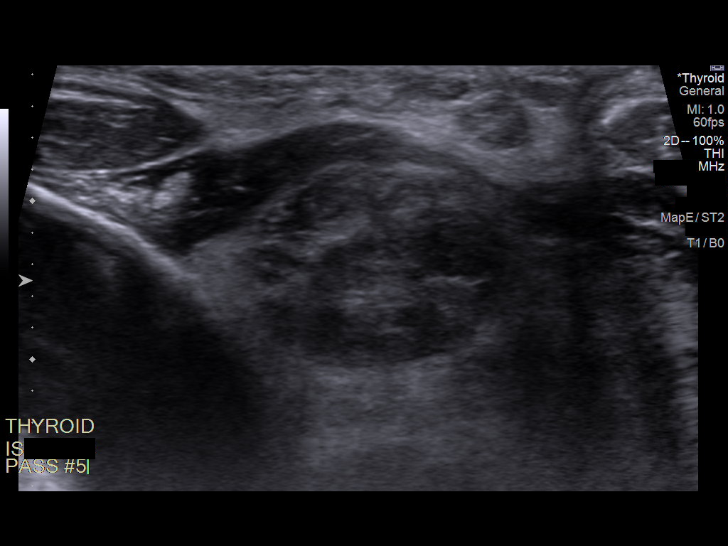
[im 17/20]
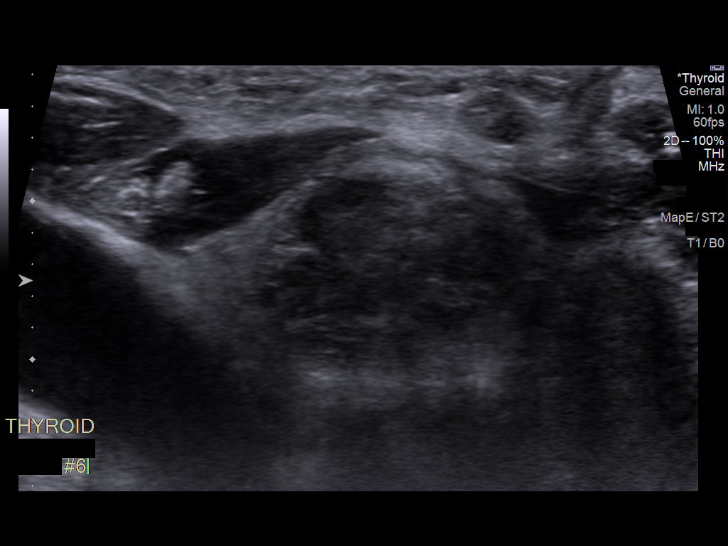
[im 18/20]
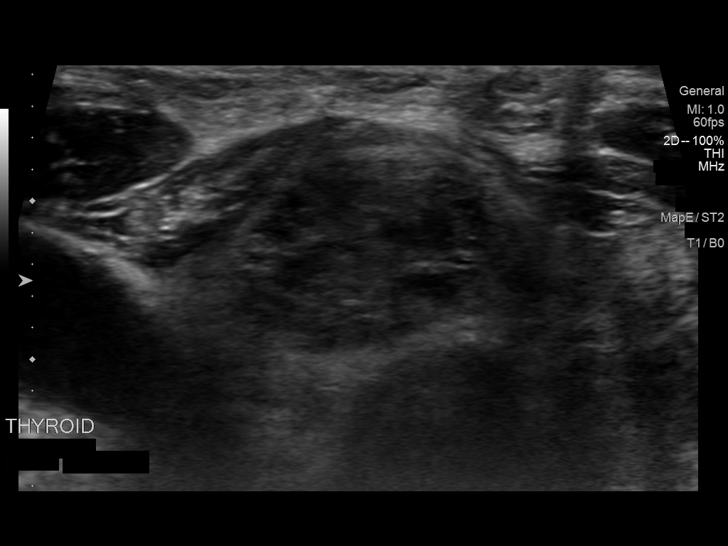
[im 20/20]
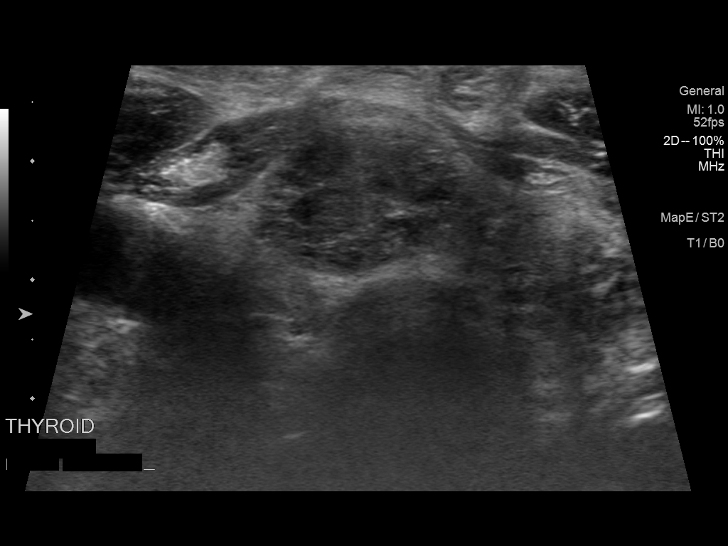

[13 of 20 positions shown; findings below may reference images not displayed]

Pre-procedural ultrasound scanning demonstrated unchanged size and
appearance of the indeterminate nodule within the right thyroid
isthmus region

The procedure was planned. The neck was prepped in the usual sterile
fashion, and a sterile drape was applied covering the operative
field. A timeout was performed prior to the initiation of the
procedure. Local anesthesia was provided with 1% lidocaine.

Under direct ultrasound guidance, 6 FNA biopsies were performed of
the right isthmus thyroid nodule with 25 gauge needles. Multiple
ultrasound images were saved for procedural documentation purposes.
The samples were prepared and submitted to pathology as well as for
Afirma testing.

Limited post procedural scanning was negative for hematoma or
additional complication. Dressings were placed. The patient
tolerated the above procedures procedure well without immediate
postprocedural complication.
FINDINGS: FINDINGS
Nodule reference number based on prior diagnostic ultrasound: 1

Maximum size: 2.0 cm

Location: Isthmus  ;  right

ACR TI-RADS total points: 4

ACR TI-RADS risk category:  TR4 (4-6 points)

Prior biopsy:  No

Reason for biopsy: meets ACR TI-RADS criteria

Ultrasound imaging confirms appropriate placement of the needles
within the thyroid nodule.
IMPRESSION: Technically successful ultrasound guided fine needle aspiration
biopsy of right isthmus thyroid nodule. Final pathology pending.

## 2022-04-29 DIAGNOSIS — D123 Benign neoplasm of transverse colon: Secondary | ICD-10-CM | POA: Diagnosis not present

## 2022-04-29 DIAGNOSIS — Z86010 Personal history of colonic polyps: Secondary | ICD-10-CM | POA: Diagnosis not present

## 2022-04-29 DIAGNOSIS — K644 Residual hemorrhoidal skin tags: Secondary | ICD-10-CM | POA: Diagnosis not present

## 2022-04-29 DIAGNOSIS — Z09 Encounter for follow-up examination after completed treatment for conditions other than malignant neoplasm: Secondary | ICD-10-CM | POA: Diagnosis not present

## 2022-04-29 DIAGNOSIS — D122 Benign neoplasm of ascending colon: Secondary | ICD-10-CM | POA: Diagnosis not present

## 2022-04-29 DIAGNOSIS — D120 Benign neoplasm of cecum: Secondary | ICD-10-CM | POA: Diagnosis not present

## 2022-05-01 DIAGNOSIS — D123 Benign neoplasm of transverse colon: Secondary | ICD-10-CM | POA: Diagnosis not present

## 2022-05-02 DIAGNOSIS — H401232 Low-tension glaucoma, bilateral, moderate stage: Secondary | ICD-10-CM | POA: Diagnosis not present

## 2022-06-24 ENCOUNTER — Encounter: Payer: Self-pay | Admitting: Podiatry

## 2022-06-24 ENCOUNTER — Ambulatory Visit: Payer: Medicare PPO | Admitting: Podiatry

## 2022-06-24 VITALS — BP 124/70

## 2022-06-24 DIAGNOSIS — Q828 Other specified congenital malformations of skin: Secondary | ICD-10-CM | POA: Diagnosis not present

## 2022-06-24 DIAGNOSIS — M79675 Pain in left toe(s): Secondary | ICD-10-CM | POA: Diagnosis not present

## 2022-06-24 DIAGNOSIS — M79674 Pain in right toe(s): Secondary | ICD-10-CM

## 2022-06-24 DIAGNOSIS — B351 Tinea unguium: Secondary | ICD-10-CM | POA: Diagnosis not present

## 2022-06-24 NOTE — Progress Notes (Signed)
  Subjective:  Patient ID: Paige Ross, female    DOB: 1941/02/27,  MRN: [id number]  Talyah D Pesnell presents to clinic today for painful porokeratotic lesion(s) b/l lower extremities and painful mycotic toenails that limit ambulation. Painful toenails interfere with ambulation. Aggravating factors include wearing enclosed shoe gear. Pain is relieved with periodic professional debridement. Painful porokeratotic lesions are aggravated when weightbearing with and without shoegear. Pain is relieved with periodic professional debridement.  Chief Complaint  Patient presents with   Nail Problem    Routine foot care PCP-Polite PCP VST-03/2022   New problem(s): None.   PCP is Renford Dills, MD.  No Known Allergies  Review of Systems: Negative except as noted in the HPI.  Objective: No changes noted in today's physical examination. Vitals:   06/24/22 0929  BP: 124/70   Paige Ross is a pleasant 81 y.o. female WD, WN in NAD. AAO x 3.  Vascular Examination: Palpable pedal pulses b/l LE. Digital hair present b/l. No pedal edema b/l. Skin temperature gradient WNL b/l. No varicosities b/l. Trace edema noted bilateral ankles. No cyanosis or clubbing noted b/l LE.Marland Kitchen  Dermatological Examination: Pedal skin with normal turgor, texture and tone b/l. No open wounds. No interdigital macerations b/l. Toenails 1-5 b/l thickened, discolored, dystrophic with subungual debris. There is pain on palpation to dorsal aspect of nailplates.   Porokeratotic lesion(s) lateral DIPJ of L 5th toe, dorsal PIPJ of R 5th toe, R 4th toe, and submet head 1 right foot. No erythema, no edema, no drainage, no fluctuance..  Neurological Examination: Protective sensation intact with 10 gram monofilament b/l LE. Vibratory sensation intact b/l LE.   Musculoskeletal Examination: Muscle strength 5/5 to all LE muscle groups b/l. 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  Assessment/Plan: 1. Pain due to  onychomycosis of toenails of both feet     No orders of the defined types were placed in this encounter. -Patient was evaluated and treated. All patient's and/or POA's questions/concerns answered on today's visit. -Toenails 1-5 b/l were debrided in length and girth with sterile nail nippers and dremel without iatrogenic bleeding.  -As a courtesy, porokeratotic lesion(s) L 5th toe, R 4th toe, R 5th toe, and submet head 1 right foot pared and enucleated without complication or incident. Total number pared=4. -Patient/POA to call should there be question/concern in the interim.   Return in about 3 months (around 09/23/2022).  Freddie Breech, DPM

## 2022-08-05 ENCOUNTER — Ambulatory Visit: Payer: Medicare PPO | Admitting: Podiatry

## 2022-08-05 DIAGNOSIS — M205X1 Other deformities of toe(s) (acquired), right foot: Secondary | ICD-10-CM

## 2022-08-05 NOTE — Progress Notes (Signed)
  Subjective:  Patient ID: Paige Ross, female    DOB: Oct 12, 1940,  MRN: [id number]  Chief Complaint  Patient presents with   Hammer Toe    Right fifth toe painful, rubs in shoes    82 y.o. female presents with the above complaint. History confirmed with patient.  She was referred to me by Dr. Elisha Ponder for a surgical consultation.  She has had multiple digital surgeries and also bunion surgery with some revision over the last 2 decades.  The other toes are now not bothering her as much, the fifth toe is still occasionally causes pain and curls in.  She seems to have been able to control this with padding and shoe gear changes so far but occasionally certain shoes do still cause pain.  She is here today to see what other options may be available  Objective:  Physical Exam: warm, good capillary refill, no trophic changes or ulcerative lesions, normal DP and PT pulses, normal sensory exam, and well-healed surgical scars over first second third and fourth MPJs and toes, has adductovarus fifth toe with hyperkeratosis and slight tenderness laterally. .  Radiographs: Multiple views x-ray of the right foot: Previous radiographs from 2017 were reviewed she has multiple digital surgeries completed, does not appear to have had a arthroplasty of the fifth toe before Assessment:   1. Adductovarus rotation of toe, acquired, right      Plan:  Patient was evaluated and treated and all questions answered.  I reviewed her x-rays independently that were previously taken.  I also discussed with the etiology and treatment options of hammertoe deformities and adductovarus contracture of the fifth toe in particular.  We discussed the recovery process that would be required for an arthroplasty and derotational skin plasty.  I discussed with her she would be able to be WBAT in a surgical shoe postop and will need to be in this for 2 to 3 weeks until sutures are healed that there may be some persistent  swelling.  We also discussed the risk and benefits of this including the possibility of infection nerve damage nerve pain and/or skin or wound healing problems.  Currently she feels that she is able to maintain comfort and is not affecting her activities of daily living when she uses the silicone offloading pad as well as comfortable shoes.  I discussed with her that I would continue this course of treatment unless this changes or worsens.  I discussed with her that beyond the age of 78 it may not be worth the risk of surgery with her current health.  She will let me know if it worsens and return for surgical planning visit if it does and we will take new radiographs at that time.  Return if symptoms worsen or fail to improve.

## 2022-08-18 DIAGNOSIS — E213 Hyperparathyroidism, unspecified: Secondary | ICD-10-CM | POA: Diagnosis not present

## 2022-08-18 DIAGNOSIS — E210 Primary hyperparathyroidism: Secondary | ICD-10-CM | POA: Diagnosis not present

## 2022-08-18 DIAGNOSIS — I10 Essential (primary) hypertension: Secondary | ICD-10-CM | POA: Diagnosis not present

## 2022-10-07 ENCOUNTER — Ambulatory Visit: Payer: Medicare PPO | Admitting: Podiatry

## 2022-10-07 DIAGNOSIS — I10 Essential (primary) hypertension: Secondary | ICD-10-CM | POA: Diagnosis not present

## 2022-10-07 DIAGNOSIS — E210 Primary hyperparathyroidism: Secondary | ICD-10-CM | POA: Diagnosis not present

## 2022-10-08 DIAGNOSIS — H04221 Epiphora due to insufficient drainage, right lacrimal gland: Secondary | ICD-10-CM | POA: Diagnosis not present

## 2022-10-27 ENCOUNTER — Encounter: Payer: Self-pay | Admitting: Podiatry

## 2022-10-27 ENCOUNTER — Ambulatory Visit: Payer: Medicare PPO | Admitting: Podiatry

## 2022-10-27 VITALS — BP 119/72 | HR 70

## 2022-10-27 DIAGNOSIS — M79674 Pain in right toe(s): Secondary | ICD-10-CM | POA: Diagnosis not present

## 2022-10-27 DIAGNOSIS — B351 Tinea unguium: Secondary | ICD-10-CM

## 2022-10-27 DIAGNOSIS — M79675 Pain in left toe(s): Secondary | ICD-10-CM | POA: Diagnosis not present

## 2022-10-27 NOTE — Progress Notes (Signed)
  Subjective:  Patient ID: Paige Ross, female    DOB: [birth year],  MRN: [id number]  Paige Ross presents to clinic today for painful porokeratotic lesion(s) both feet and painful mycotic toenails that limit ambulation. Painful toenails interfere with ambulation. Aggravating factors include wearing enclosed shoe gear. Pain is relieved with periodic professional debridement. Painful porokeratotic lesions are aggravated when weightbearing with and without shoegear. Pain is relieved with periodic professional debridement.  Chief Complaint  Patient presents with   NAIL CARE    RFC   New problem(s): None.   PCP is Renford Dills, MD.  No Known Allergies  Review of Systems: Negative except as noted in the HPI.  Objective: No changes noted in today's physical examination. Vitals:   10/27/22 1030  BP: 119/72  Pulse: 70   Paige Ross is a pleasant 82 y.o. female WD, WN in NAD. AAO x 3.  Vascular Examination: Palpable pedal pulses b/l LE. Digital hair present b/l. No pedal edema b/l. Skin temperature gradient WNL b/l. No varicosities b/l. Trace edema noted bilateral ankles. No cyanosis or clubbing noted b/l LE.Marland Kitchen  Dermatological Examination: Pedal skin with normal turgor, texture and tone b/l. No open wounds. No interdigital macerations b/l. Toenails 1-5 b/l thickened, discolored, dystrophic with subungual debris. There is pain on palpation to dorsal aspect of nailplates.   Porokeratotic lesion(s) lateral DIPJ of L 5th toe, dorsal PIPJ of R 5th toe, R 4th toe, and submet head 1 right foot. No erythema, no edema, no drainage, no fluctuance.  Neurological Examination: Protective sensation intact with 10 gram monofilament b/l LE. Vibratory sensation intact b/l LE.   Musculoskeletal Examination: Muscle strength 5/5 to all LE muscle groups b/l. 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  Assessment/Plan: 1. Pain due to onychomycosis of toenails of both feet      -Consent given for treatment as described below: -Examined patient. -Patient to continue soft, supportive shoe gear daily. -Mycotic toenails 1-5 bilaterally were debrided in length and girth with sterile nail nippers and dremel without incident. -As a courtesy, porokeratotic lesion(s) bilateral 5th toes, R 4th toe, and submet head 1 right foot pared and enucleated without complication or incident. Total number pared=4. -Patient/POA to call should there be question/concern in the interim.   Return in about 3 months (around 01/26/2023).  Freddie Breech, DPM

## 2022-11-03 DIAGNOSIS — H401232 Low-tension glaucoma, bilateral, moderate stage: Secondary | ICD-10-CM | POA: Diagnosis not present

## 2022-11-19 DIAGNOSIS — H401432 Capsular glaucoma with pseudoexfoliation of lens, bilateral, moderate stage: Secondary | ICD-10-CM | POA: Diagnosis not present

## 2022-12-08 DIAGNOSIS — L821 Other seborrheic keratosis: Secondary | ICD-10-CM | POA: Diagnosis not present

## 2022-12-08 DIAGNOSIS — L819 Disorder of pigmentation, unspecified: Secondary | ICD-10-CM | POA: Diagnosis not present

## 2023-01-08 DIAGNOSIS — S29012A Strain of muscle and tendon of back wall of thorax, initial encounter: Secondary | ICD-10-CM | POA: Diagnosis not present

## 2023-01-08 DIAGNOSIS — S39012A Strain of muscle, fascia and tendon of lower back, initial encounter: Secondary | ICD-10-CM | POA: Diagnosis not present

## 2023-01-08 DIAGNOSIS — M9905 Segmental and somatic dysfunction of pelvic region: Secondary | ICD-10-CM | POA: Diagnosis not present

## 2023-01-08 DIAGNOSIS — M9902 Segmental and somatic dysfunction of thoracic region: Secondary | ICD-10-CM | POA: Diagnosis not present

## 2023-01-08 DIAGNOSIS — M9903 Segmental and somatic dysfunction of lumbar region: Secondary | ICD-10-CM | POA: Diagnosis not present

## 2023-01-08 DIAGNOSIS — S338XXA Sprain of other parts of lumbar spine and pelvis, initial encounter: Secondary | ICD-10-CM | POA: Diagnosis not present

## 2023-02-04 DIAGNOSIS — M9903 Segmental and somatic dysfunction of lumbar region: Secondary | ICD-10-CM | POA: Diagnosis not present

## 2023-02-04 DIAGNOSIS — M9905 Segmental and somatic dysfunction of pelvic region: Secondary | ICD-10-CM | POA: Diagnosis not present

## 2023-02-04 DIAGNOSIS — S338XXA Sprain of other parts of lumbar spine and pelvis, initial encounter: Secondary | ICD-10-CM | POA: Diagnosis not present

## 2023-02-04 DIAGNOSIS — M9902 Segmental and somatic dysfunction of thoracic region: Secondary | ICD-10-CM | POA: Diagnosis not present

## 2023-02-04 DIAGNOSIS — S39012A Strain of muscle, fascia and tendon of lower back, initial encounter: Secondary | ICD-10-CM | POA: Diagnosis not present

## 2023-02-04 DIAGNOSIS — S29012A Strain of muscle and tendon of back wall of thorax, initial encounter: Secondary | ICD-10-CM | POA: Diagnosis not present

## 2023-02-18 DIAGNOSIS — Z1231 Encounter for screening mammogram for malignant neoplasm of breast: Secondary | ICD-10-CM | POA: Diagnosis not present

## 2023-02-27 DIAGNOSIS — E8770 Fluid overload, unspecified: Secondary | ICD-10-CM | POA: Diagnosis not present

## 2023-02-27 DIAGNOSIS — R600 Localized edema: Secondary | ICD-10-CM | POA: Diagnosis not present

## 2023-02-27 DIAGNOSIS — I10 Essential (primary) hypertension: Secondary | ICD-10-CM | POA: Diagnosis not present

## 2023-03-11 DIAGNOSIS — S338XXA Sprain of other parts of lumbar spine and pelvis, initial encounter: Secondary | ICD-10-CM | POA: Diagnosis not present

## 2023-03-11 DIAGNOSIS — S39012A Strain of muscle, fascia and tendon of lower back, initial encounter: Secondary | ICD-10-CM | POA: Diagnosis not present

## 2023-03-11 DIAGNOSIS — M9902 Segmental and somatic dysfunction of thoracic region: Secondary | ICD-10-CM | POA: Diagnosis not present

## 2023-03-11 DIAGNOSIS — M9905 Segmental and somatic dysfunction of pelvic region: Secondary | ICD-10-CM | POA: Diagnosis not present

## 2023-03-11 DIAGNOSIS — M9903 Segmental and somatic dysfunction of lumbar region: Secondary | ICD-10-CM | POA: Diagnosis not present

## 2023-03-11 DIAGNOSIS — S29012A Strain of muscle and tendon of back wall of thorax, initial encounter: Secondary | ICD-10-CM | POA: Diagnosis not present

## 2023-03-17 ENCOUNTER — Ambulatory Visit: Payer: Medicare PPO | Admitting: Podiatry

## 2023-03-17 ENCOUNTER — Encounter: Payer: Self-pay | Admitting: Podiatry

## 2023-03-17 VITALS — BP 121/67 | HR 83

## 2023-03-17 DIAGNOSIS — Q828 Other specified congenital malformations of skin: Secondary | ICD-10-CM | POA: Diagnosis not present

## 2023-03-17 DIAGNOSIS — B351 Tinea unguium: Secondary | ICD-10-CM

## 2023-03-17 DIAGNOSIS — M79674 Pain in right toe(s): Secondary | ICD-10-CM | POA: Diagnosis not present

## 2023-03-17 DIAGNOSIS — M79675 Pain in left toe(s): Secondary | ICD-10-CM | POA: Diagnosis not present

## 2023-03-17 NOTE — Progress Notes (Signed)
  Subjective:  Patient ID: Paige Ross, female    DOB: 01/27/1941,  MRN: [id number]  82 y.o. female presents to clinic with  painful porokeratotic lesion(s) of both feet and painful mycotic toenails that limit ambulation. Painful toenails interfere with ambulation. Aggravating factors include wearing enclosed shoe gear. Pain is relieved with periodic professional debridement. Painful porokeratotic lesions are aggravated when weightbearing with and without shoegear. Pain is relieved with periodic professional debridement.  Chief Complaint  Patient presents with   Nail Problem    RFC/Doesn't know A1C   New problem(s): None   PCP is Renford Dills, MD.  No Known Allergies  Review of Systems: Negative except as noted in the HPI.   Objective:  Paige Ross is a pleasant 82 y.o. female WD, WN in NAD.Marland Kitchen  Vascular Examination: Vascular status intact b/l with palpable pedal pulses. CFT immediate b/l. No edema. No pain with calf compression b/l. Skin temperature gradient WNL b/l. Trace edema noted BLE.  Neurological Examination: Sensation grossly intact b/l with 10 gram monofilament. Vibratory sensation intact b/l.   Dermatological Examination: Pedal skin with normal turgor, texture and tone b/l. Toenails 1-5 b/l thick, discolored, elongated with subungual debris and pain on dorsal palpation. Porokeratotic lesion(s) L 5th toe, R 4th toe, R 5th toe, and submet head 1 right foot. No erythema, no edema, no drainage, no fluctuance.  Musculoskeletal Examination: Muscle strength 5/5 to b/l LE. 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  Radiographs: None  Assessment:   1. Pain due to onychomycosis of toenails of both feet   2. Porokeratosis   3. Pain in right toe(s)    Plan:  -Patient was evaluated and treated. All patient's and/or POA's questions/concerns answered on today's visit. -No new findings. No new orders. -Continue foot and shoe inspections daily. Monitor blood  glucose per PCP/Endocrinologist's recommendations. -Patient to continue soft, supportive shoe gear daily. -Toenails 1-5 b/l were debrided in length and girth with sterile nail nippers without iatrogenic bleeding.  -Porokeratotic lesion(s) bilateral 5th toes, right fourth digit, and submet head 1 right foot pared and enucleated with sterile currette without incident. Total number of lesions debrided=4. -Patient/POA to call should there be question/concern in the interim.  Return in about 3 months (around 06/17/2023).  Freddie Breech, DPM

## 2023-04-08 DIAGNOSIS — S338XXA Sprain of other parts of lumbar spine and pelvis, initial encounter: Secondary | ICD-10-CM | POA: Diagnosis not present

## 2023-04-08 DIAGNOSIS — M9903 Segmental and somatic dysfunction of lumbar region: Secondary | ICD-10-CM | POA: Diagnosis not present

## 2023-04-08 DIAGNOSIS — S39012A Strain of muscle, fascia and tendon of lower back, initial encounter: Secondary | ICD-10-CM | POA: Diagnosis not present

## 2023-04-08 DIAGNOSIS — M9902 Segmental and somatic dysfunction of thoracic region: Secondary | ICD-10-CM | POA: Diagnosis not present

## 2023-04-08 DIAGNOSIS — S29012A Strain of muscle and tendon of back wall of thorax, initial encounter: Secondary | ICD-10-CM | POA: Diagnosis not present

## 2023-04-08 DIAGNOSIS — M9905 Segmental and somatic dysfunction of pelvic region: Secondary | ICD-10-CM | POA: Diagnosis not present

## 2023-05-12 DIAGNOSIS — E213 Hyperparathyroidism, unspecified: Secondary | ICD-10-CM | POA: Diagnosis not present

## 2023-05-12 DIAGNOSIS — J302 Other seasonal allergic rhinitis: Secondary | ICD-10-CM | POA: Diagnosis not present

## 2023-05-12 DIAGNOSIS — Z5181 Encounter for therapeutic drug level monitoring: Secondary | ICD-10-CM | POA: Diagnosis not present

## 2023-05-12 DIAGNOSIS — M810 Age-related osteoporosis without current pathological fracture: Secondary | ICD-10-CM | POA: Diagnosis not present

## 2023-05-12 DIAGNOSIS — Z0000 Encounter for general adult medical examination without abnormal findings: Secondary | ICD-10-CM | POA: Diagnosis not present

## 2023-05-12 DIAGNOSIS — H409 Unspecified glaucoma: Secondary | ICD-10-CM | POA: Diagnosis not present

## 2023-05-12 DIAGNOSIS — E7800 Pure hypercholesterolemia, unspecified: Secondary | ICD-10-CM | POA: Diagnosis not present

## 2023-05-12 DIAGNOSIS — I10 Essential (primary) hypertension: Secondary | ICD-10-CM | POA: Diagnosis not present

## 2023-05-13 ENCOUNTER — Other Ambulatory Visit: Payer: Self-pay | Admitting: Internal Medicine

## 2023-05-13 DIAGNOSIS — H401232 Low-tension glaucoma, bilateral, moderate stage: Secondary | ICD-10-CM | POA: Diagnosis not present

## 2023-05-13 DIAGNOSIS — Z961 Presence of intraocular lens: Secondary | ICD-10-CM | POA: Diagnosis not present

## 2023-05-13 DIAGNOSIS — M810 Age-related osteoporosis without current pathological fracture: Secondary | ICD-10-CM

## 2023-06-17 ENCOUNTER — Encounter: Payer: Self-pay | Admitting: Podiatry

## 2023-06-17 ENCOUNTER — Ambulatory Visit: Payer: Medicare PPO | Admitting: Podiatry

## 2023-06-17 DIAGNOSIS — M79674 Pain in right toe(s): Secondary | ICD-10-CM | POA: Diagnosis not present

## 2023-06-17 DIAGNOSIS — B351 Tinea unguium: Secondary | ICD-10-CM

## 2023-06-17 DIAGNOSIS — Q828 Other specified congenital malformations of skin: Secondary | ICD-10-CM

## 2023-06-17 DIAGNOSIS — M79675 Pain in left toe(s): Secondary | ICD-10-CM

## 2023-06-17 NOTE — Progress Notes (Signed)
  Subjective:  Patient ID: Paige Ross, female    DOB: 1940-10-01,  MRN: [id number]  82 y.o. female presents to clinic with  painful porokeratotic lesion(s) of both feet and painful mycotic toenails that limit ambulation. Painful toenails interfere with ambulation. Aggravating factors include wearing enclosed shoe gear. Pain is relieved with periodic professional debridement. Painful porokeratotic lesions are aggravated when weightbearing with and without shoegear. Pain is relieved with periodic professional debridement.  Chief Complaint  Patient presents with   Nail Problem    RFC.   New problem(s): None   PCP is Renford Dills, MD.  No Known Allergies  Review of Systems: Negative except as noted in the HPI.   Objective:  Chane D Brockschmidt is a pleasant 82 y.o. female WD, WN in NAD.Marland Kitchen  Vascular Examination: Vascular status intact b/l with palpable pedal pulses. CFT immediate b/l. No edema. No pain with calf compression b/l. Skin temperature gradient WNL b/l. Trace edema noted BLE.  Neurological Examination: Sensation grossly intact b/l with 10 gram monofilament. Vibratory sensation intact b/l.   Dermatological Examination: Pedal skin with normal turgor, texture and tone b/l. Toenails 1-5 b/l thick, discolored, elongated with subungual debris and pain on dorsal palpation. Porokeratotic lesion(s) L 5th toe, R 4th toe, R 5th toe, and submet head 1 right foot. No erythema, no edema, no drainage, no fluctuance.  Musculoskeletal Examination: Muscle strength 5/5 to b/l LE. 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  Radiographs: None  Assessment:   1. Pain due to onychomycosis of toenails of both feet    Plan:  -Patient was evaluated and treated. All patient's and/or POA's questions/concerns answered on today's visit. -No new findings. No new orders. -Continue foot and shoe inspections daily. Monitor blood glucose per PCP/Endocrinologist's recommendations. -Patient to  continue soft, supportive shoe gear daily. -Toenails 1-5 b/l were debrided in length and girth with sterile nail nippers without iatrogenic bleeding.  -Porokeratotic lesion(s) bilateral 5th toes, right fourth digit, and submet head 1 right foot pared and enucleated with sterile currette without incident. Total number of lesions debrided=4. -Patient/POA to call should there be question/concern in the interim.  No follow-ups on file.  Louann Sjogren, DPM

## 2023-09-15 ENCOUNTER — Ambulatory Visit: Payer: Medicare PPO | Admitting: Podiatry

## 2023-09-15 ENCOUNTER — Encounter: Payer: Self-pay | Admitting: Podiatry

## 2023-09-15 VITALS — Ht 62.0 in | Wt 165.0 lb

## 2023-09-15 DIAGNOSIS — M79671 Pain in right foot: Secondary | ICD-10-CM

## 2023-09-15 DIAGNOSIS — Q828 Other specified congenital malformations of skin: Secondary | ICD-10-CM

## 2023-09-15 DIAGNOSIS — M79675 Pain in left toe(s): Secondary | ICD-10-CM | POA: Diagnosis not present

## 2023-09-15 DIAGNOSIS — B351 Tinea unguium: Secondary | ICD-10-CM

## 2023-09-15 DIAGNOSIS — M79672 Pain in left foot: Secondary | ICD-10-CM | POA: Diagnosis not present

## 2023-09-15 DIAGNOSIS — M79674 Pain in right toe(s): Secondary | ICD-10-CM | POA: Diagnosis not present

## 2023-09-17 DIAGNOSIS — L821 Other seborrheic keratosis: Secondary | ICD-10-CM | POA: Diagnosis not present

## 2023-09-17 DIAGNOSIS — L218 Other seborrheic dermatitis: Secondary | ICD-10-CM | POA: Diagnosis not present

## 2023-09-18 NOTE — Progress Notes (Signed)
  Subjective:  Patient ID: Paige Ross, female    DOB: 1941-05-10,  MRN: [id number]  Paige Ross presents to clinic today for painful porokeratotic lesion(s) b/l feet and painful mycotic toenails that limit ambulation. Painful toenails interfere with ambulation. Aggravating factors include wearing enclosed shoe gear. Pain is relieved with periodic professional debridement. Painful porokeratotic lesions are aggravated when weightbearing with and without shoegear. Pain is relieved with periodic professional debridement.  Chief Complaint  Patient presents with   Nail Problem    Pt is here for [facility] PCP is Dr Nehemiah Settle and LOV was in October.   New problem(s): None.   PCP is Renford Dills, MD.  No Known Allergies  Review of Systems: Negative except as noted in the HPI.  Objective: No changes noted in today's physical examination. There were no vitals filed for this visit. Paige Ross is a pleasant 83 y.o. female in NAD. AAO x 3.  Vascular Examination: Vascular status intact b/l with palpable pedal pulses. CFT immediate b/l. No edema. No pain with calf compression b/l. Skin temperature gradient WNL b/l. Trace edema noted BLE.  Neurological Examination: Sensation grossly intact b/l with 10 gram monofilament. Vibratory sensation intact b/l.   Dermatological Examination: Pedal skin with normal turgor, texture and tone b/l. Toenails 1-5 b/l thick, discolored, elongated with subungual debris and pain on dorsal palpation. Porokeratotic lesion(s) L 5th toe, R 4th toe, R 5th toe, and submet head 1 right foot. No erythema, no edema, no drainage, no fluctuance.  Musculoskeletal Examination: Muscle strength 5/5 to b/l LE. 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  Radiographs: None  Assessment/Plan: 1. Pain due to onychomycosis of toenails of both feet   2. Porokeratosis   3. Pain in both feet     Patient was evaluated and treated. All patient's and/or POA's  questions/concerns addressed on today's visit. Mycotic toenails 1-5 debrided in length and girth without incident. Porokeratotic lesion(s) L 5th toe, right fourth digit, R 5th toe, and submet head 1 right foot pared with sharp debridement without incident. Continue soft, supportive shoe gear daily. Report any pedal injuries to medical professional. Call office if there are any questions/concerns. -Patient/POA to call should there be question/concern in the interim.   Return in about 4 months (around 01/13/2024).  Freddie Breech, DPM      [facility] LOCATION: [address] [place] [address]                   Office [phone]   [facility] LOCATION: [address] [place], [place] [address] Office [phone]

## 2023-10-12 DIAGNOSIS — E7800 Pure hypercholesterolemia, unspecified: Secondary | ICD-10-CM | POA: Diagnosis not present

## 2023-10-12 DIAGNOSIS — J302 Other seasonal allergic rhinitis: Secondary | ICD-10-CM | POA: Diagnosis not present

## 2023-10-12 DIAGNOSIS — R059 Cough, unspecified: Secondary | ICD-10-CM | POA: Diagnosis not present

## 2023-11-13 DIAGNOSIS — H401232 Low-tension glaucoma, bilateral, moderate stage: Secondary | ICD-10-CM | POA: Diagnosis not present

## 2023-11-24 DIAGNOSIS — M810 Age-related osteoporosis without current pathological fracture: Secondary | ICD-10-CM | POA: Diagnosis not present

## 2023-11-24 DIAGNOSIS — H409 Unspecified glaucoma: Secondary | ICD-10-CM | POA: Diagnosis not present

## 2023-11-24 DIAGNOSIS — Z0000 Encounter for general adult medical examination without abnormal findings: Secondary | ICD-10-CM | POA: Diagnosis not present

## 2023-11-24 DIAGNOSIS — Z1331 Encounter for screening for depression: Secondary | ICD-10-CM | POA: Diagnosis not present

## 2023-11-24 DIAGNOSIS — Z23 Encounter for immunization: Secondary | ICD-10-CM | POA: Diagnosis not present

## 2023-11-24 DIAGNOSIS — E213 Hyperparathyroidism, unspecified: Secondary | ICD-10-CM | POA: Diagnosis not present

## 2023-11-24 DIAGNOSIS — E7800 Pure hypercholesterolemia, unspecified: Secondary | ICD-10-CM | POA: Diagnosis not present

## 2023-11-24 DIAGNOSIS — I10 Essential (primary) hypertension: Secondary | ICD-10-CM | POA: Diagnosis not present

## 2023-12-21 ENCOUNTER — Telehealth: Payer: Self-pay | Admitting: Podiatry

## 2023-12-21 NOTE — Telephone Encounter (Signed)
 Patient would like to know if it is okay to soak toe with Epsom salt? Patient contact telephone number, [phone]

## 2023-12-25 ENCOUNTER — Ambulatory Visit: Admitting: Podiatry

## 2023-12-25 ENCOUNTER — Encounter: Payer: Self-pay | Admitting: Podiatry

## 2023-12-25 DIAGNOSIS — M21619 Bunion of unspecified foot: Secondary | ICD-10-CM

## 2023-12-25 DIAGNOSIS — M2041 Other hammer toe(s) (acquired), right foot: Secondary | ICD-10-CM

## 2023-12-27 ENCOUNTER — Other Ambulatory Visit: Payer: Self-pay | Admitting: Internal Medicine

## 2023-12-27 DIAGNOSIS — M810 Age-related osteoporosis without current pathological fracture: Secondary | ICD-10-CM

## 2023-12-28 NOTE — Progress Notes (Signed)
 Subjective:   Patient ID: Paige Ross, female   DOB: 83 y.o.   MRN: [id number]   HPI Patient presents concerned about digital deformity right and history of bunion with fixation right foot.  Has not had any other changes in health history or other issues   ROS      Objective:  Physical Exam  Neurovascular status intact muscle strength adequate range of motion adequate with inflammation around the fifth digit and mild prominence around the first metatarsal history of structural correction     Assessment:  Inflammatory condition right possible tight shoe gear along with history of fixation right     Plan:  H&P x-ray reviewed and discussed wider shoes anti-inflammatories and possible fixation removal at 1 point in future.  We will try this first see how he responds  X-rays indicate that there is no signs of abnormal digital position or pathology from that standpoint with fixation in place

## 2024-01-13 ENCOUNTER — Encounter: Payer: Self-pay | Admitting: Podiatry

## 2024-01-13 ENCOUNTER — Ambulatory Visit: Payer: Medicare PPO | Admitting: Podiatry

## 2024-01-13 DIAGNOSIS — M79674 Pain in right toe(s): Secondary | ICD-10-CM | POA: Diagnosis not present

## 2024-01-13 DIAGNOSIS — B351 Tinea unguium: Secondary | ICD-10-CM

## 2024-01-13 DIAGNOSIS — M79675 Pain in left toe(s): Secondary | ICD-10-CM | POA: Diagnosis not present

## 2024-01-14 ENCOUNTER — Other Ambulatory Visit: Payer: Medicare PPO

## 2024-01-17 NOTE — Progress Notes (Signed)
  Subjective:  Patient ID: Paige Ross, female    DOB: 1940/09/24,  MRN: [id number]  Ariel D Payes presents to clinic today for painful porokeratotic lesion(s) of both feet and painful mycotic toenails that limit ambulation. Painful toenails interfere with ambulation. Aggravating factors include wearing enclosed shoe gear. Pain is relieved with periodic professional debridement. Painful porokeratotic lesions are aggravated when weightbearing with and without shoegear. Pain is relieved with periodic professional debridement.   New problem(s): None.   PCP is Rexanne Ingle, MD.  No Known Allergies  Review of Systems: Negative except as noted in the HPI.  Objective: No changes noted in today's physical examination. There were no vitals filed for this visit. Paige Ross is a pleasant 83 y.o. female in NAD. AAO x 3.   Vascular Examination: Capillary refill time immediate b/l. Palpable pedal pulses. Pedal hair present b/l. No pain with calf compression b/l. Skin temperature gradient WNL b/l. No cyanosis or clubbing b/l. No ischemia or gangrene noted b/l. No edema noted b/l LE.  Neurological Examination: Sensation grossly intact b/l with 10 gram monofilament. Vibratory sensation intact b/l.   Dermatological Examination: Pedal skin with normal turgor, texture and tone b/l.  No open wounds. No interdigital macerations.   Toenails 1-5 b/l thick, discolored, elongated with subungual debris and pain on dorsal palpation.   Porokeratotic lesion(s) dorsal PIPJ of bilateral 5th toes and submet head 1 right foot. No erythema, no edema, no drainage, no fluctuance.  Musculoskeletal Examination: Muscle strength 5/5 to all lower extremity muscle groups bilaterally. Hammertoe(s) b/l 5th toes.  Radiographs: None  Assessment/Plan: 1. Pain due to onychomycosis of toenails of both feet     Patient was evaluated and treated. All patient's and/or POA's questions/concerns addressed on  today's visit. Toenails 1-5 debrided in length and girth without incident. Porokeratotic lesion(s) dorsal PIPJ of bilateral 5th toes and submet head 1 right foot pared with sharp debridement without incident. Continue soft, supportive shoe gear daily. Report any pedal injuries to medical professional. Call office if there are any questions/concerns.  Return in about 3 months (around 04/14/2024).  Delon LITTIE Merlin, DPM      [place] LOCATION: [address] [place] [address]                   Office [phone]   [facility] LOCATION: [address] [place], [place] [address] Office [phone]

## 2024-01-18 ENCOUNTER — Telehealth: Payer: Self-pay

## 2024-01-18 NOTE — Telephone Encounter (Signed)
 Patient called and left a message - her 5th toenail was skipped at her last trim on 6/25. Can she be worked in to trim that one nail? - it started hurting. She doesn't want to have to wait until October if she can help it.

## 2024-01-19 DIAGNOSIS — M5417 Radiculopathy, lumbosacral region: Secondary | ICD-10-CM | POA: Diagnosis not present

## 2024-01-19 DIAGNOSIS — M9905 Segmental and somatic dysfunction of pelvic region: Secondary | ICD-10-CM | POA: Diagnosis not present

## 2024-01-19 DIAGNOSIS — S39012A Strain of muscle, fascia and tendon of lower back, initial encounter: Secondary | ICD-10-CM | POA: Diagnosis not present

## 2024-01-19 DIAGNOSIS — S29012A Strain of muscle and tendon of back wall of thorax, initial encounter: Secondary | ICD-10-CM | POA: Diagnosis not present

## 2024-01-19 DIAGNOSIS — M9902 Segmental and somatic dysfunction of thoracic region: Secondary | ICD-10-CM | POA: Diagnosis not present

## 2024-01-19 DIAGNOSIS — M9903 Segmental and somatic dysfunction of lumbar region: Secondary | ICD-10-CM | POA: Diagnosis not present

## 2024-01-25 DIAGNOSIS — M5417 Radiculopathy, lumbosacral region: Secondary | ICD-10-CM | POA: Diagnosis not present

## 2024-01-25 DIAGNOSIS — S29012A Strain of muscle and tendon of back wall of thorax, initial encounter: Secondary | ICD-10-CM | POA: Diagnosis not present

## 2024-01-25 DIAGNOSIS — M9903 Segmental and somatic dysfunction of lumbar region: Secondary | ICD-10-CM | POA: Diagnosis not present

## 2024-01-25 DIAGNOSIS — M9905 Segmental and somatic dysfunction of pelvic region: Secondary | ICD-10-CM | POA: Diagnosis not present

## 2024-01-25 DIAGNOSIS — M9902 Segmental and somatic dysfunction of thoracic region: Secondary | ICD-10-CM | POA: Diagnosis not present

## 2024-01-25 DIAGNOSIS — S39012A Strain of muscle, fascia and tendon of lower back, initial encounter: Secondary | ICD-10-CM | POA: Diagnosis not present

## 2024-01-26 ENCOUNTER — Encounter: Payer: Self-pay | Admitting: Podiatry

## 2024-01-26 ENCOUNTER — Ambulatory Visit: Admitting: Podiatry

## 2024-01-26 DIAGNOSIS — M79672 Pain in left foot: Secondary | ICD-10-CM

## 2024-01-26 DIAGNOSIS — M79671 Pain in right foot: Secondary | ICD-10-CM

## 2024-01-31 NOTE — Progress Notes (Signed)
  Subjective:  Patient ID: Paige Ross, female    DOB: 06/10/1941,  MRN: [id number]  Paige Ross presents to clinic today for no charge visit. Patient states she still has some pain in both 5th toes.  Chief Complaint  Patient presents with   RFC    Non diabetic. pain on 5th toes   New problem(s): None.   PCP is Rexanne Ingle, MD.  No Known Allergies  Review of Systems: Negative except as noted in the HPI.  Objective: No changes noted in today's physical examination. There were no vitals filed for this visit. Paige Ross is a pleasant 83 y.o. female WD, WN in NAD. AAO x 3.  Neurovascular status intact and unchanged b/l.  Dermatological Examination: Pedal skin with normal turgor, texture and tone b/l.  No open wounds. No interdigital macerations.   Toenails 1-4 b/l recently debrided. Bilateral 5th digits with tenderness of lateral borders b/l. No erythema, no edema, no drainage b/l.  Musculoskeletal Examination: Muscle strength 5/5 to all lower extremity muscle groups bilaterally. Hammertoe(s) b/l 5th toes.  Radiographs: None  Assessment/Plan: 1. Pain in both feet     -Patient was evaluated today. All questions/concerns addressed on today's visit. -Patient to continue soft, supportive shoe gear daily. -As a courtesy, toenails bilateral 5th toes were debrided in length and girth with sterile nail nippers and dremel file without incident. -Patient/POA to call should there be question/concern in the interim.   No follow-ups on file.  Paige Ross, DPM      [place] LOCATION: [address], [place] [address]                   Office [phone]   [facility] LOCATION: [address] [place], [place] [address] Office [phone]

## 2024-02-08 DIAGNOSIS — S39012A Strain of muscle, fascia and tendon of lower back, initial encounter: Secondary | ICD-10-CM | POA: Diagnosis not present

## 2024-02-08 DIAGNOSIS — S29012A Strain of muscle and tendon of back wall of thorax, initial encounter: Secondary | ICD-10-CM | POA: Diagnosis not present

## 2024-02-08 DIAGNOSIS — M9903 Segmental and somatic dysfunction of lumbar region: Secondary | ICD-10-CM | POA: Diagnosis not present

## 2024-02-08 DIAGNOSIS — M9905 Segmental and somatic dysfunction of pelvic region: Secondary | ICD-10-CM | POA: Diagnosis not present

## 2024-02-08 DIAGNOSIS — M9902 Segmental and somatic dysfunction of thoracic region: Secondary | ICD-10-CM | POA: Diagnosis not present

## 2024-02-08 DIAGNOSIS — M5417 Radiculopathy, lumbosacral region: Secondary | ICD-10-CM | POA: Diagnosis not present

## 2024-02-22 ENCOUNTER — Telehealth: Payer: Self-pay | Admitting: Podiatry

## 2024-02-22 NOTE — Telephone Encounter (Signed)
 Having same type growth, like blister between 2nd and 3rd toe side R foot, very painful. Calling for advise and recommendation if she can come to see you or need to see another provider. She states you have treated her for this before on her great toe. Would like to be seen this week for it is very painful.

## 2024-02-24 ENCOUNTER — Ambulatory Visit: Admitting: Podiatry

## 2024-02-24 DIAGNOSIS — L97512 Non-pressure chronic ulcer of other part of right foot with fat layer exposed: Secondary | ICD-10-CM | POA: Diagnosis not present

## 2024-02-24 MED ORDER — MUPIROCIN 2 % EX OINT: 1.0000 | TOPICAL_OINTMENT | Freq: Two times a day (BID) | CUTANEOUS | 2 refills | Status: AC

## 2024-02-24 NOTE — Patient Instructions (Signed)

## 2024-02-24 NOTE — Progress Notes (Signed)
 Patient presents with complaint of sore area with some drainage on the medial aspect of the third toe right.  Has not noticed any redness or purulence.  Says it started out looking like a blister.  No nausea or vomiting or fever or chills  Physical Exam:  Patient alert and oriented x 3.  No complaints of nausea, vomiting, fever, or chills  Vascular: DP pulses 2/4 bilateral. PT pulses 2/4 lateral.  Moderate right edema lower extremities bilaterally. Some localized edema in the third toe right. Capillary fill time immediate.  Dermatologic: Full-thickness ulceration medial aspect PIPJ of the third toe right penetrating the subcutaneous tissue.  Moderate clear drainage no signs of infection.  Tenderness.. Measures 2.5 mm wide x 2.5 mm long x 3 deep.   Neurologic:   Musculoskeletal: Severe hammertoe deformities 2 through 5 bilaterally   Diagnoses: 1.  Full-thickness ulcer Wagner grade 2 medial aspect PIPJ third toe right.  Plan: -Discussed with the ulceration and etiology and treatment.  I will have her do local wound care on the area to allow it to heal wear good wide athletic shoe like she is wearing today. -Sharp debridement full-thickness ulceration medial aspect third toe right.  Sharply debrided and devitalized tissue to bleeding base into the subcutaneous layer.SABRA  Applied antibiotic ointment and a light dressing. - Wound care: Soak in luk the, apply Bactroban  ointment, and apply a light dressing.  Ewarm Epsom salt water 15 minutes twice daily - Rx Bactroban  ointment,  apply twice daily to wound  Return 2 weeks f/u ulcer to right

## 2024-03-03 ENCOUNTER — Other Ambulatory Visit (HOSPITAL_BASED_OUTPATIENT_CLINIC_OR_DEPARTMENT_OTHER)

## 2024-03-07 DIAGNOSIS — M9902 Segmental and somatic dysfunction of thoracic region: Secondary | ICD-10-CM | POA: Diagnosis not present

## 2024-03-07 DIAGNOSIS — M9903 Segmental and somatic dysfunction of lumbar region: Secondary | ICD-10-CM | POA: Diagnosis not present

## 2024-03-07 DIAGNOSIS — M9905 Segmental and somatic dysfunction of pelvic region: Secondary | ICD-10-CM | POA: Diagnosis not present

## 2024-03-07 DIAGNOSIS — S39012A Strain of muscle, fascia and tendon of lower back, initial encounter: Secondary | ICD-10-CM | POA: Diagnosis not present

## 2024-03-07 DIAGNOSIS — S29012A Strain of muscle and tendon of back wall of thorax, initial encounter: Secondary | ICD-10-CM | POA: Diagnosis not present

## 2024-03-07 DIAGNOSIS — M5417 Radiculopathy, lumbosacral region: Secondary | ICD-10-CM | POA: Diagnosis not present

## 2024-03-09 ENCOUNTER — Ambulatory Visit: Admitting: Podiatry

## 2024-03-09 ENCOUNTER — Encounter: Payer: Self-pay | Admitting: Podiatry

## 2024-03-09 VITALS — Ht 62.0 in | Wt 165.0 lb

## 2024-03-09 DIAGNOSIS — M7751 Other enthesopathy of right foot: Secondary | ICD-10-CM | POA: Diagnosis not present

## 2024-03-09 NOTE — Progress Notes (Signed)
 Patient presents follow-up ulcer third toe right.  Doing better a little bit of aching in the toe but sharp pain is gone.  Has not noticed any drainage from the toe.  No fevers chills or nausea or vomiting.  She is wearing a good wide mesh upper athletic shoes today.   Physical exam:  General appearance: Pleasant, and in no acute distress. AOx3.  Vascular: Pedal pulses: DP 2/4 bilaterally, PT 2/4 bilaterally. Mild edema lower legs bilaterally. Capillary fill time immediate 5 bilaterally.  Neurological: Grossly intact bilaterally  Dermatologic:   Ulceration medial aspect third toe at PIPJ resolved with no signs of infection.  Skin normal temperature bilaterally.  Skin normal color, tone, and texture bilaterally.   Musculoskeletal: Tenderness over PIPJ of second third toe right.  Hammertoes 2 through 5 bilaterally    Diagnosis: 1.  Bursitis medial aspect third toe at PIPJ right..  Plan: -Established office visit for evaluation and management.  Level 3. - Discussed with the hammertoes and the resulting ulceration and bursitis.  Declined injection today.  Gave her a silicone sleeve for the third toe for she can use second toe for the right foot.  This will hopefully cut down the friction between the toes.  Wear shoes with wide toe boxes.  Wear good well cushioned socks.  If becomes more painful or if she notices signs of breakdown again she should call the office immediately   Return as needed

## 2024-03-10 DIAGNOSIS — Z1231 Encounter for screening mammogram for malignant neoplasm of breast: Secondary | ICD-10-CM | POA: Diagnosis not present

## 2024-04-06 DIAGNOSIS — M810 Age-related osteoporosis without current pathological fracture: Secondary | ICD-10-CM | POA: Diagnosis not present

## 2024-04-11 DIAGNOSIS — M9905 Segmental and somatic dysfunction of pelvic region: Secondary | ICD-10-CM | POA: Diagnosis not present

## 2024-04-11 DIAGNOSIS — M5417 Radiculopathy, lumbosacral region: Secondary | ICD-10-CM | POA: Diagnosis not present

## 2024-04-11 DIAGNOSIS — M9902 Segmental and somatic dysfunction of thoracic region: Secondary | ICD-10-CM | POA: Diagnosis not present

## 2024-04-11 DIAGNOSIS — M9903 Segmental and somatic dysfunction of lumbar region: Secondary | ICD-10-CM | POA: Diagnosis not present

## 2024-04-11 DIAGNOSIS — S39012A Strain of muscle, fascia and tendon of lower back, initial encounter: Secondary | ICD-10-CM | POA: Diagnosis not present

## 2024-04-11 DIAGNOSIS — S29012A Strain of muscle and tendon of back wall of thorax, initial encounter: Secondary | ICD-10-CM | POA: Diagnosis not present

## 2024-04-14 DIAGNOSIS — H401232 Low-tension glaucoma, bilateral, moderate stage: Secondary | ICD-10-CM | POA: Diagnosis not present

## 2024-04-14 DIAGNOSIS — H52203 Unspecified astigmatism, bilateral: Secondary | ICD-10-CM | POA: Diagnosis not present

## 2024-04-14 DIAGNOSIS — Z961 Presence of intraocular lens: Secondary | ICD-10-CM | POA: Diagnosis not present

## 2024-04-19 ENCOUNTER — Ambulatory Visit (INDEPENDENT_AMBULATORY_CARE_PROVIDER_SITE_OTHER)

## 2024-04-19 ENCOUNTER — Ambulatory Visit: Admitting: Podiatry

## 2024-04-19 ENCOUNTER — Encounter: Payer: Self-pay | Admitting: Podiatry

## 2024-04-19 DIAGNOSIS — M2041 Other hammer toe(s) (acquired), right foot: Secondary | ICD-10-CM

## 2024-04-19 DIAGNOSIS — M7751 Other enthesopathy of right foot: Secondary | ICD-10-CM

## 2024-04-19 MED ORDER — DEXAMETHASONE SODIUM PHOSPHATE 120 MG/30ML IJ SOLN
2.0000 mg | Freq: Once | INTRAMUSCULAR | Status: AC
  Administered 2024-04-19: 2 mg

## 2024-04-19 NOTE — Progress Notes (Signed)
 Still complains of pain on the third toe right.  Also complaining pain in the fifth toe right.  She has been using the sleeve on the third toe which she says helps but is still extremely painful.  This area ulcerated a month and a half ago but is healed from the ulceration.  She has not noticed any drainage.  Pain with walking and wearing shoes.  She had surgery on foot many years ago.   Physical exam:  General appearance: Pleasant, and in no acute distress. AOx3.  Vascular: Pedal pulses: DP 2/4 bilaterally, PT 2/4 bilaterally.  Mild to moderate edema lower legs bilaterally. Capillary fill time immediate right.  Neurological: Grossly intact bilaterally  Dermatologic:   Very tender hyperkeratotic lesion plantar medial aspect of third toe right.  Appears to have had a fusion of the PIPJ years ago however there is now hyperextension at the DIPJ.  Skin normal temperature bilaterally.  Skin normal color, tone, and texture bilaterally.   Musculoskeletal: Hammertoe third toe right with dorsiflexory contracture at the DIPJ.  Hammertoe fifth toe right with tenderness over the PIPJ of the fifth toe.  Radiographs: 3 views foot right: Status post fusion PIPJ third toe right with dorsiflexor a contracture of the DIPJ of the third toe.  Hammertoe fifth toe right. S/p Lapidus, Aiken osteotomy, fusion second toe right. and second metatarsal osteotomy.  Notes any fractures or bone tumors.  Slight decrease in bone density.  Diagnosis: 1.  Bursitis DIPJ third toe right 2.  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  Given the amount of pain that she has had with it and history of ulceration about a month and a half ago would advise surgical correction.  The third toe briefly discussed with her that this could involve a fusion of the DIPJ or just a phalangectomy of the distal  phalanx third toe right, and correction hammertoe fifth toe right.  Given lack of response to conservative treatment I think this would be her best option for pain relief.  Will do a corticosteroid injection at the inflamed bursa at the toe to give her symptomatic relief until surgical consult can be scheduled. -injected 1.0cc mixture of 0.5cc 0.5% Marcaine and  0.5cc Dexamethasone sodium phosphate USP at bursa plantar medial DIPJ third toe right -Dispensed cushion sleeve for the fifth toe right and continue using the 1 for the third toe right..   Scheduled for surgical consult for correction hammertoe 3 and 5 RT.

## 2024-04-23 DIAGNOSIS — Z23 Encounter for immunization: Secondary | ICD-10-CM | POA: Diagnosis not present

## 2024-05-02 ENCOUNTER — Telehealth: Payer: Self-pay | Admitting: Podiatry

## 2024-05-02 NOTE — Telephone Encounter (Signed)
 As per patient requested to remind Dr. Gaynel, copay has been paid as of 01/26/2024, and patient is requesting for credit for 05/03/2024 visit

## 2024-05-03 ENCOUNTER — Ambulatory Visit: Admitting: Podiatry

## 2024-05-03 ENCOUNTER — Encounter: Payer: Self-pay | Admitting: Podiatry

## 2024-05-03 DIAGNOSIS — M79675 Pain in left toe(s): Secondary | ICD-10-CM | POA: Diagnosis not present

## 2024-05-03 DIAGNOSIS — M79674 Pain in right toe(s): Secondary | ICD-10-CM | POA: Diagnosis not present

## 2024-05-03 DIAGNOSIS — B351 Tinea unguium: Secondary | ICD-10-CM | POA: Diagnosis not present

## 2024-05-04 ENCOUNTER — Telehealth: Payer: Self-pay | Admitting: Podiatry

## 2024-05-04 NOTE — Telephone Encounter (Deleted)
 Pt want to know what is the next step because she is still in pain and discomfort pt does not see Dr.McDonald until 11/6 so what can be done in the meantime for the pain and swelling please advise pt [phone]

## 2024-05-05 ENCOUNTER — Ambulatory Visit: Admitting: Podiatry

## 2024-05-05 VITALS — Ht 62.0 in | Wt 165.0 lb

## 2024-05-05 DIAGNOSIS — M2041 Other hammer toe(s) (acquired), right foot: Secondary | ICD-10-CM | POA: Diagnosis not present

## 2024-05-08 ENCOUNTER — Encounter: Payer: Self-pay | Admitting: Podiatry

## 2024-05-08 NOTE — Progress Notes (Signed)
  Subjective:  Patient ID: Paige Ross, female    DOB: 02/05/1941,  MRN: [id number]  Chief Complaint  Patient presents with   Toe Pain    RM 20 Patient is here for pain in the right 2nd and 3rd toes.     83 y.o. female presents with the above complaint. History confirmed with patient.  She returns for follow-up her toes are still quite painful.  The worst part is the third toe rubbing on the second toe into her fifth toe with a callus  Objective:  Physical Exam: warm, good capillary refill, no trophic changes or ulcerative lesions, normal DP and PT pulses, normal sensory exam, and well-healed surgical scars over first second third and fourth MPJs and toes, has adductovarus fifth toe with hyperkeratosis and slight tenderness laterally. .  Radiographs: Multiple views x-ray of the right foot: New films taken today shows previous second toe arthroplasty digital contractures of multiple digits. Assessment:   1. Hammer toe of right foot      Plan:  Patient was evaluated and treated and all questions answered.  We reviewed her x-rays taken today and discussed again her digital deformities.  We discussed that the toes could be revised with arthroplasty of the 2nd, 3rd and 5th toes.  We discussed the risks and benefits of this including the risk of further deformity and pain in the toes.  We also discussed elective 2nd or 3rd amputation as an alternative which she is not interested in proceeding with.  We discussed the risk benefits and potential, occasions which include but are not limited to pain, swelling, infection, scar, numbness which may be temporary or permanent, chronic pain, stiffness, nerve pain or damage, wound healing problems, bone healing problems including delayed or non-union .  She understands and wishes to proceed.  All questions addressed.  Informed consent signed and reviewed.   Surgical plan:  Procedure: - Revision 2nd and 3rd hammertoe corrections with  arthroplasty and fifth toe arthroplasty with corn excision  Location: - [facility]  Anesthesia plan: - Sedation with local  Postoperative pain plan: -  Tylenol 1000 mg every 6 hours,  oxycodone 5 mg 1-2 tabs every 6 hours only as needed  DVT prophylaxis: - None required  WB Restrictions / DME needs: - WBAT in surgical shoe postop.   No follow-ups on file.

## 2024-05-08 NOTE — Progress Notes (Signed)
  Subjective:  Patient ID: Paige Ross, female    DOB: 01-24-1941,  MRN: [id number]  Trichelle D Akkerman presents to clinic today for painful porokeratotic lesion(s) of both feet and painful mycotic toenails that limit ambulation. Painful toenails interfere with ambulation. Aggravating factors include wearing enclosed shoe gear. Pain is relieved with periodic professional debridement. Painful porokeratotic lesions are aggravated when weightbearing with and without shoegear. Pain is relieved with periodic professional debridement.   She has seen Dr. Christine for ulcer right 3rd digit  ulceration and will be seeing Dr. Silva for h/o interdigital ulceration right 3rd toe. She has had surgery on digits of right foot and will discuss options with Dr. Silva on the visit.  PCP is Rexanne Ingle, MD.  No Known Allergies  Review of Systems: Negative except as noted in the HPI.  Objective: No changes noted in today's physical examination. There were no vitals filed for this visit. Paige Ross is a pleasant 83 y.o. female in NAD. AAO x 3.  Vascular Examination: Capillary refill time immediate b/l. Palpable pedal pulses. Pedal hair present b/l. Pedal edema absent. No pain with calf compression b/l. Skin temperature gradient WNL b/l. No cyanosis or clubbing b/l. No ischemia or gangrene noted b/l.   Neurological Examination: Sensation grossly intact b/l with 10 gram monofilament. Vibratory sensation intact b/l.   Dermatological Examination: Pedal skin with normal turgor, texture and tone b/l.  No open wounds. No interdigital macerations.   Toenails 1-5 b/l thick, discolored, elongated with subungual debris and pain on dorsal palpation.   Porokeratotic lesion(s) dorsal PIPJ of bilateral 5th toes and submet head 1 right foot. No erythema, no edema, no drainage, no fluctuance.  Musculoskeletal Examination: Muscle strength 5/5 to all lower extremity muscle groups bilaterally.  Hammertoe(s) b/l 5th toes.. No pain, crepitus or joint limitation noted with ROM b/l LE.  Patient ambulates independently without assistive aids.  Radiographs: None  Assessment/Plan: 1. Pain due to onychomycosis of toenails of both feet    -Patient was evaluated today. All questions/concerns addressed on today's visit. -Consent given for treatment as described below: -Mycotic toenails 1-5 bilaterally were debrided in length and girth with sterile nail nippers and dremel without incident. -As a courtesy, porokeratotic lesion(s) dorsal PIPJ of bilateral 5th toes and submet head 1 right foot pared and enucleated without complication or incident. Total number pared=3. -Patient/POA to call should there be question/concern in the interim.   Return in about 3 months (around 08/03/2024).  Paige Ross, DPM      [place] LOCATION: [address], [place] [address]                   Office [phone]   [facility] LOCATION: [address] [place], [place] [address] Office [phone]

## 2024-05-23 ENCOUNTER — Telehealth: Payer: Self-pay | Admitting: Podiatry

## 2024-05-23 NOTE — Telephone Encounter (Signed)
 Called and left message for patient to contact office to schedule surgery.

## 2024-05-26 ENCOUNTER — Ambulatory Visit: Admitting: Podiatry

## 2024-05-30 NOTE — Telephone Encounter (Signed)
 Called and left second voicemail for patient as she called and left message over the weekend to schedule surgery.

## 2024-05-31 DIAGNOSIS — E7800 Pure hypercholesterolemia, unspecified: Secondary | ICD-10-CM | POA: Diagnosis not present

## 2024-05-31 DIAGNOSIS — Z5181 Encounter for therapeutic drug level monitoring: Secondary | ICD-10-CM | POA: Diagnosis not present

## 2024-05-31 DIAGNOSIS — R269 Unspecified abnormalities of gait and mobility: Secondary | ICD-10-CM | POA: Diagnosis not present

## 2024-05-31 DIAGNOSIS — I10 Essential (primary) hypertension: Secondary | ICD-10-CM | POA: Diagnosis not present

## 2024-05-31 DIAGNOSIS — H409 Unspecified glaucoma: Secondary | ICD-10-CM | POA: Diagnosis not present

## 2024-05-31 DIAGNOSIS — E213 Hyperparathyroidism, unspecified: Secondary | ICD-10-CM | POA: Diagnosis not present

## 2024-05-31 DIAGNOSIS — M810 Age-related osteoporosis without current pathological fracture: Secondary | ICD-10-CM | POA: Diagnosis not present

## 2024-05-31 DIAGNOSIS — Z23 Encounter for immunization: Secondary | ICD-10-CM | POA: Diagnosis not present

## 2024-06-09 ENCOUNTER — Encounter: Payer: Self-pay | Admitting: Physical Therapy

## 2024-06-09 ENCOUNTER — Ambulatory Visit: Attending: Internal Medicine | Admitting: Physical Therapy

## 2024-06-09 ENCOUNTER — Other Ambulatory Visit: Payer: Self-pay

## 2024-06-09 DIAGNOSIS — M6281 Muscle weakness (generalized): Secondary | ICD-10-CM | POA: Diagnosis not present

## 2024-06-09 DIAGNOSIS — R2689 Other abnormalities of gait and mobility: Secondary | ICD-10-CM | POA: Diagnosis not present

## 2024-06-09 NOTE — Therapy (Signed)
 OUTPATIENT PHYSICAL THERAPY THORACOLUMBAR EVALUATION  Patient Name: Paige Ross MRN: [id number] DOB:1940-10-04, 83 y.o., female Today's Date: 06/09/2024   PT End of Session - 06/09/24 1417     Visit Number 1    Number of Visits --   1-2x/week   Date for Recertification  08/04/24    Authorization Type [employer] MCR    PT Start Time 1145    PT Stop Time 1226    PT Time Calculation (min) 41 min          Past Medical History:  Diagnosis Date   Glaucoma    Past Surgical History:  Procedure Laterality Date   ABDOMINAL HYSTERECTOMY     BUNIONECTOMY  2005   Dr. Minus EDWARDS Right 1989   EYE SURGERY     HAMMER TOE SURGERY Right 2008   3rd toe Dr. Harden FREES TOE SURGERY Right 2013   Dr. Magdalen FREES TOE SURGERY Right 10/09/2020   Right 3rd digit Dr. 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   Dyssomnia 03/11/2022   Essential hypertension 03/11/2022   Hypercalcemia 03/11/2022   Non-toxic multinodular goiter 03/11/2022   Other seasonal allergic rhinitis 03/11/2022   History of colonic polyps 03/11/2022   Hyperparathyroidism 03/11/2022   Pure hypercholesterolemia 03/11/2022   Laryngopharyngeal reflux (LPR) 02/23/2020   Post-nasal drainage 02/23/2020   Allergies 11/09/2019   Right ear impacted cerumen 11/09/2019   Glaucoma     PCP: Rexanne Ingle, MD  REFERRING PROVIDER: Rexanne Ingle, MD  THERAPY DIAG:  Other abnormalities of gait and mobility - Plan: PT plan of care cert/re-cert  Muscle weakness - Plan: PT plan of care cert/re-cert  REFERRING DIAG: Unspecified abnormalities of gait and mobility [R26.9]   Rationale for Evaluation and Treatment:  Rehabilitation  SUBJECTIVE:  PERTINENT PAST HISTORY:  Osteoporosis      PRECAUTIONS: Fall  WEIGHT BEARING RESTRICTIONS No  FALLS:   Has patient fallen in last 6 months? No, Number of falls: 0  MOI/History of condition:  Onset date: slow onset with worsening over the last few months  SUBJECTIVE STATEMENT  Pt is a 83 y.o. female who presents to clinic with chief complaint of progressively flexed posture which makes her feel less stable.  She has no significant pain.  Was going to a chiro which she felt was helpful for her posture.  Pain:  Are you having pain? No   Occupation: retired  Education Administrator: [employer] used intermittently   Higher Education Careers Adviser Dominance: NA  Patient Goals/Specific Activities: lifting, improving strength, posture   OBJECTIVE:    GENERAL OBSERVATION/GAIT: Significant forward flexed trunk during gait  LUMBAR AROM  AROM AROM  (Eval)  Flexion   Extension limited by 75%  Right lateral flexion limited by 25%  Left lateral flexion limited by 25%  Right rotation   Left rotation     (Blank rows = not tested)   LE MMT:  MMT Right (Eval) Left (Eval)  Hip flexion (L2, L3) 4 4  Knee extension (L3) 4 4  Knee flexion 4 4  Hip abduction 3 3  Hip extension 3 3  Hip external rotation    Hip internal rotation    Hip adduction    Ankle dorsiflexion (L4)    Ankle plantarflexion (S1)    Ankle inversion    Ankle eversion    [employer]  Toe ext (L5)    Grossly     (Blank rows = not tested, score listed is out of 5 possible points OR may be listed in lbs of force.  N = WNL, D = diminished, C = clear for gross weakness with myotome testing, * = concordant pain with testing)  LE ROM:  ROM Right (Eval) Left (Eval)  Hip flexion    Hip extension    Hip abduction    Hip adduction    Hip internal rotation    Hip external rotation    Knee extension    Knee flexion    Ankle dorsiflexion n n  Ankle plantarflexion    Ankle inversion    Ankle eversion     (Blank rows = not tested, N = WNL, * = concordant pain with testing)  Functional Tests  Eval    30'' STS: 10x  UE used? Y    Progressive  balance screen (highest level completed for >/= 10''):  Feet together: 10'' Semi Tandem: R in rear 10'', L in rear 10''  bil unstble Tandem: R in rear <10'', L in rear <10''                                                        PATIENT SURVEYS:  Patient Specific Functional Scale:  Activity Eval         Standing upright during walking 3         Failing to get up from chair without UE 1         Complete bridge 1                   Average 1.66          (Activities rated 0-10/10.  10 represents able to perform at prior level" while 0 represents "unable to perform." )  HOME EXERCISE PROGRAM: Access Code: [email or web address] URL: [email or web address] Date: 06/09/2024 Prepared by: Helene Gasmen  Exercises - Sit to Stand Without Arm Support  - 1 x daily - 4-7 x weekly - 3 sets - 5 reps - Supine Bridge  - 1 x daily - 4-7 x weekly - 3 sets - 5-10 reps - 2-3 seconds hold - Standing Shoulder Row with Anchored Resistance  - 1 x daily - 4-7 x weekly - 2-3 sets - 10 reps - Standing Romberg to 3/4 Tandem Stance  - 1 x daily - 7 x weekly - 1 sets - 3 reps - 45'' hold  Treatment priorities   Eval        Thoracic/lumbar ext mobility        Hip ext/lumbar ext strength        balance        STS progression                 TODAY'S TREATMENT:  Therapeutic Exercise: Creating, reviewing, and completing below HEP   PATIENT EDUCATION ([facility]):  POC, diagnosis, prognosis, HEP, and outcome measures.  Pt educated via explanation, demonstration, and handout (HEP).  Pt confirms understanding verbally.   ASSESSMENT:  CLINICAL IMPRESSION: Caera is a 83 y.o. female who presents to clinic with signs and sxs consistent with deficits in gait stability and posture.   Pt with poor back and hip extensor strength leading to forward flexed posture in gait.  Lavona will benefit from skilled PT to address relevant deficits and improve safety and independence in upright functional  mobility.   OBJECTIVE IMPAIRMENTS: lumbar ROM, hip and core strength, gait, balance  ACTIVITY LIMITATIONS: standing, walking, STS transfers, stability in gait  PERSONAL FACTORS: See medical history and pertinent history   REHAB POTENTIAL: Good  CLINICAL DECISION MAKING: Evolving/moderate complexity  EVALUATION COMPLEXITY: Moderate   GOALS:   SHORT TERM GOALS: Target date: 07/07/2024   Cheyanna will be >75% HEP compliant to improve carryover between sessions and facilitate independent management of condition  Evaluation: ongoing Goal status: INITIAL   LONG TERM GOALS: Target date: 08/04/2024   Kahmari will report confidence in maintenance of balance at time of discharge with advanced HEP  Evaluation/Baseline: unable to self manage Goal status: INITIAL   2.  Edda will be able to stand for >15'' in tandem stance, to show a significant improvement in balance in order to reduce fall risk   Evaluation/Baseline: unable to maintain semi tandem for >10'' bil Goal status: INITIAL   3.  Evalisse will improve 30'' STS (MCID 2) to >/= 8x (w/ UE?: n) to show improved LE strength and improved transfers   Evaluation/Baseline: 10x  w/ UE? Y Goal status: INITIAL   4.  Day will be able to walk for with significantly improve upright posture to improve stability in gait and reduce strain on low back  Evaluation/Baseline: limited Goal status: INITIAL   5.  Rogenia will show a >/= 2 pt improvement in their average PSFS score as a proxy for functional improvement   Evaluation/Baseline:  Patient Specific Functional Scale:  Activity Eval         Standing upright during walking 3         Failing to get up from chair without UE 1         Complete bridge 1                   Average 1.66          (Activities rated 0-10/10.  10 represents able to perform at prior level" while 0 represents "unable to perform." )  Minimum detectable change (90%CI) for average score = 2  points Minimum detectable change (90%CI) for single activity score = 3 points   Goal status: INITIAL    PLAN: PT FREQUENCY: 1-2x/week  PT DURATION: 8 weeks  PLANNED INTERVENTIONS: Therapeutic exercises, Aquatic therapy, Therapeutic activity, Neuro Muscular re-education, Gait training, Patient/Family education, Joint mobilization, Dry Needling, Electrical stimulation, Spinal mobilization and/or manipulation, Moist heat, Taping, Vasopneumatic device, Ionotophoresis 4mg /ml Dexamethasone, and Manual therapy   Ananias Kolander PT, DPT 06/09/2024, 4:38 PM

## 2024-06-20 ENCOUNTER — Ambulatory Visit: Admitting: Podiatry

## 2024-06-20 DIAGNOSIS — M7751 Other enthesopathy of right foot: Secondary | ICD-10-CM | POA: Diagnosis not present

## 2024-06-20 NOTE — Progress Notes (Signed)
 Complaint of tenderness around the 2nd, 3rd and 5th toes right foot.  Has been sore when he has areas.  Does have some padded shoes but they are still getting sore.  She seen Dr. Mcdonell for a surgical consult already but is planning to wait till the weather gets warmer to do it.   Physical exam:  General appearance: Pleasant, and in no acute distress. AOx3.  Vascular: Pedal pulses: DP 2/4 bilaterally, PT 2/4 bilaterally.  Mild edema lower legs bilaterally. Capillary fill time immediate bilaterally.  Neurological: Grossly intact bilaterally  Dermatologic:   Skin normal temperature bilaterally.  Skin normal color, tone, and texture bilaterally.   Musculoskeletal: Tenderness of the PIPJ of the fifth toe right.  Tenderness on the medial aspect of the PIPJ of the 2nd and 3rd toes right.  Severe hammertoe deformities bilaterally hallux valgus deformity.   Diagnosis: 1.  Capsulitis inner phalangeal joints toes 2, 3, and 5 right  Plan: -Established office visit for evaluation and management.  Level 3.   -Discussed with her that her best option long-term will be get the surgery done as discussed with Dr. Silva.  Recommended that she use the sleeves on the toe to help cushion these.  She can also use some OTC diclofenac gel on the areas to decrease pain.  Wear good shoes that do not put any pressure on the toes. -Declined injection today on painful fifth toe  Return as needed

## 2024-06-24 ENCOUNTER — Ambulatory Visit: Admitting: Physical Therapy

## 2024-06-29 ENCOUNTER — Ambulatory Visit: Admitting: Physical Therapy

## 2024-06-29 ENCOUNTER — Encounter: Payer: Self-pay | Admitting: Physical Therapy

## 2024-06-29 DIAGNOSIS — M6281 Muscle weakness (generalized): Secondary | ICD-10-CM | POA: Insufficient documentation

## 2024-06-29 DIAGNOSIS — R2689 Other abnormalities of gait and mobility: Secondary | ICD-10-CM

## 2024-06-29 NOTE — Therapy (Signed)
 OUTPATIENT PHYSICAL THERAPY DAILY NOTE  Patient Name: Paige Ross MRN: [id number] DOB:[birth year], 83 y.o., female Today's Date: 06/29/2024   PT End of Session - 06/29/24 1017     Visit Number 2    Number of Visits --   1-2x/week   Date for Recertification  08/04/24    Authorization Type [employer] MCR    PT Start Time 1017    PT Stop Time 1058    PT Time Calculation (min) 41 min          Past Medical History:  Diagnosis Date   Glaucoma    Past Surgical History:  Procedure Laterality Date   ABDOMINAL HYSTERECTOMY     BUNIONECTOMY  2005   Dr. Minus EDWARDS Right 1989   EYE SURGERY     HAMMER TOE SURGERY Right 2008   3rd toe Dr. Harden FREES TOE SURGERY Right 2013   Dr. Magdalen FREES TOE SURGERY Right 10/09/2020   Right 3rd digit Dr. 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   Dyssomnia 03/11/2022   Essential hypertension 03/11/2022   Hypercalcemia 03/11/2022   Non-toxic multinodular goiter 03/11/2022   Other seasonal allergic rhinitis 03/11/2022   History of colonic polyps 03/11/2022   Hyperparathyroidism 03/11/2022   Pure hypercholesterolemia 03/11/2022   Laryngopharyngeal reflux (LPR) 02/23/2020   Post-nasal drainage 02/23/2020   Allergies 11/09/2019   Right ear impacted cerumen 11/09/2019   Glaucoma     PCP: Rexanne Ingle, MD  REFERRING PROVIDER: Rexanne Ingle, MD  THERAPY DIAG:  Other abnormalities of gait and mobility  Muscle weakness  REFERRING DIAG: Unspecified abnormalities of gait and mobility [R26.9]   Rationale for Evaluation and Treatment:  Rehabilitation  SUBJECTIVE:  PERTINENT PAST HISTORY:  Osteoporosis      PRECAUTIONS: Fall  WEIGHT BEARING RESTRICTIONS No  FALLS:  Has patient fallen in last 6 months? No, Number of falls: 0  MOI/History of  condition:  Onset date: slow onset with worsening over the last few months  SUBJECTIVE STATEMENT  06/29/2024:  Pt reports that she has been doing fairly well.  She had a catch in her back which prevented her from doing her bridges.  EVAL: Pt is a 83 y.o. female who presents to clinic with chief complaint of progressively flexed posture which makes her feel less stable.  She has no significant pain.  Was going to a chiro which she felt was helpful for her posture.  Pain:  Are you having pain? No   Occupation: retired  Education Administrator: [employer] used intermittently   Higher Education Careers Adviser Dominance: NA  Patient Goals/Specific Activities: lifting, improving strength, posture   OBJECTIVE:    GENERAL OBSERVATION/GAIT: Significant forward flexed trunk during gait  LUMBAR AROM  AROM AROM  (Eval)  Flexion   Extension limited by 75%  Right lateral flexion limited by 25%  Left lateral flexion limited by 25%  Right rotation   Left rotation     (Blank rows = not tested)   LE MMT:  MMT Right (Eval) Left (Eval)  Hip flexion (L2, L3) 4 4  Knee extension (L3) 4 4  Knee flexion 4 4  Hip abduction 3 3  Hip extension 3 3  Hip external rotation    Hip internal rotation    Hip adduction    Ankle dorsiflexion (L4)    Ankle plantarflexion (S1)  Ankle inversion    Ankle eversion    Great Toe ext (L5)    Grossly     (Blank rows = not tested, score listed is out of 5 possible points OR may be listed in lbs of force.  N = WNL, D = diminished, C = clear for gross weakness with myotome testing, * = concordant pain with testing)  LE ROM:  ROM Right (Eval) Left (Eval)  Hip flexion    Hip extension    Hip abduction    Hip adduction    Hip internal rotation    Hip external rotation    Knee extension    Knee flexion    Ankle dorsiflexion n n  Ankle plantarflexion    Ankle inversion    Ankle eversion     (Blank rows = not tested, N = WNL, * = concordant pain with testing)  Functional  Tests  Eval    30'' STS: 10x  UE used? Y    Progressive balance screen (highest level completed for >/= 10''):  Feet together: 10'' Semi Tandem: R in rear 10'', L in rear 10''  bil unstble Tandem: R in rear <10'', L in rear <10''                                                        PATIENT SURVEYS:  Patient Specific Functional Scale:  Activity Eval         Standing upright during walking 3         Failing to get up from chair without UE 1         Complete bridge 1                   Average 1.66          (Activities rated 0-10/10.  10 represents able to perform at prior level while 0 represents unable to perform. )  HOME EXERCISE PROGRAM: Access Code: [email or web address] URL: [email or web address] Date: 06/29/2024 Prepared by: Helene Gasmen  Exercises - Sit to Stand Without Arm Support  - 1 x daily - 4-7 x weekly - 3 sets - 5 reps - Standing Shoulder Row with Anchored Resistance  - 1 x daily - 4-7 x weekly - 2-3 sets - 10 reps - Standing Romberg to 3/4 Tandem Stance  - 1 x daily - 7 x weekly - 1 sets - 3 reps - 45'' hold - Standing Hip Abduction with Counter Support  - 1 x daily - 7 x weekly - 2 sets - 10 reps - Standing Hip Extension with Counter Support  - 1 x daily - 7 x weekly - 2 sets - 10 reps  Treatment priorities   Eval        Thoracic/lumbar ext mobility        Hip ext/lumbar ext strength        balance        STS progression                 TODAY'S TREATMENT:  [facility] Adult PT Treatment  06/29/2024:  Therapeutic Exercise:  nu-step L5 32m while taking subjective and planning session with patient  Therapeutic Activity  Standing hip abd - 2x10 Standing hip et - 2x10 - toe slide STS - w/ airex - 5x, 2x5 w/ OH  press Heel raises - 2x10 Standing row with concentration on trunk ext - 2x10    ASSESSMENT:  CLINICAL IMPRESSION: Concentrated on extensor strengthening in functional positions.  Tolerated well with pt noting  fatigue but no pain.  Continued deficits in hip abd and ext strength.  Updated HEP.  Will consider integrating balance next visit.  OBJECTIVE IMPAIRMENTS: lumbar ROM, hip and core strength, gait, balance  ACTIVITY LIMITATIONS: standing, walking, STS transfers, stability in gait  PERSONAL FACTORS: See medical history and pertinent history   REHAB POTENTIAL: Good  CLINICAL DECISION MAKING: Evolving/moderate complexity  EVALUATION COMPLEXITY: Moderate   GOALS:   SHORT TERM GOALS: Target date: 07/07/2024   Nyellie will be >75% HEP compliant to improve carryover between sessions and facilitate independent management of condition  Evaluation: ongoing Goal status: INITIAL   LONG TERM GOALS: Target date: 08/04/2024   Rufus will report confidence in maintenance of balance at time of discharge with advanced HEP  Evaluation/Baseline: unable to self manage Goal status: INITIAL   2.  Akemi will be able to stand for >15'' in tandem stance, to show a significant improvement in balance in order to reduce fall risk   Evaluation/Baseline: unable to maintain semi tandem for >10'' bil Goal status: INITIAL   3.  Lynden will improve 30'' STS (MCID 2) to >/= 8x (w/ UE?: n) to show improved LE strength and improved transfers   Evaluation/Baseline: 10x  w/ UE? Y Goal status: INITIAL   4.  Azaliyah will be able to walk for with significantly improve upright posture to improve stability in gait and reduce strain on low back  Evaluation/Baseline: limited Goal status: INITIAL   5.  Aisley will show a >/= 2 pt improvement in their average PSFS score as a proxy for functional improvement   Evaluation/Baseline:  Patient Specific Functional Scale:  Activity Eval         Standing upright during walking 3         Failing to get up from chair without UE 1         Complete bridge 1                   Average 1.66          (Activities rated 0-10/10.  10 represents able to perform  at prior level while 0 represents unable to perform. )  Minimum detectable change (90%CI) for average score = 2 points Minimum detectable change (90%CI) for single activity score = 3 points   Goal status: INITIAL    PLAN: PT FREQUENCY: 1-2x/week  PT DURATION: 8 weeks  PLANNED INTERVENTIONS: Therapeutic exercises, Aquatic therapy, Therapeutic activity, Neuro Muscular re-education, Gait training, Patient/Family education, Joint mobilization, Dry Needling, Electrical stimulation, Spinal mobilization and/or manipulation, Moist heat, Taping, Vasopneumatic device, Ionotophoresis 4mg /ml Dexamethasone , and Manual therapy   Helene Gasmen PT, DPT 06/29/2024, 11:29 AM

## 2024-07-01 ENCOUNTER — Ambulatory Visit: Admitting: Physical Therapy

## 2024-07-01 ENCOUNTER — Encounter: Payer: Self-pay | Admitting: Physical Therapy

## 2024-07-01 DIAGNOSIS — R2689 Other abnormalities of gait and mobility: Secondary | ICD-10-CM | POA: Diagnosis not present

## 2024-07-01 DIAGNOSIS — M6281 Muscle weakness (generalized): Secondary | ICD-10-CM

## 2024-07-01 NOTE — Therapy (Signed)
 OUTPATIENT PHYSICAL THERAPY DAILY NOTE  Patient Name: Paige Ross MRN: [id number] DOB:Apr 13, 1941, 83 y.o., female Today's Date: 07/01/2024   PT End of Session - 07/01/24 1018     Visit Number 3    Number of Visits 10   1-2x/week   Date for Recertification  08/04/24    Authorization Type [employer] MCR    Authorization Time Period [facility]- approved 10 PT visits 06/24/24-08/04/24    PT Start Time 1018    PT Stop Time 1058    PT Time Calculation (min) 40 min          Past Medical History:  Diagnosis Date   Glaucoma    Past Surgical History:  Procedure Laterality Date   ABDOMINAL HYSTERECTOMY     BUNIONECTOMY  2005   Dr. Minus EDWARDS Right 1989   EYE SURGERY     HAMMER TOE SURGERY Right 2008   3rd toe Dr. Harden FREES TOE SURGERY Right 2013   Dr. Magdalen FREES TOE SURGERY Right 10/09/2020   Right 3rd digit Dr. 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   Dyssomnia 03/11/2022   Essential hypertension 03/11/2022   Hypercalcemia 03/11/2022   Non-toxic multinodular goiter 03/11/2022   Other seasonal allergic rhinitis 03/11/2022   History of colonic polyps 03/11/2022   Hyperparathyroidism 03/11/2022   Pure hypercholesterolemia 03/11/2022   Laryngopharyngeal reflux (LPR) 02/23/2020   Post-nasal drainage 02/23/2020   Allergies 11/09/2019   Right ear impacted cerumen 11/09/2019   Glaucoma     PCP: Rexanne Ingle, MD  REFERRING PROVIDER: Rexanne Ingle, MD  THERAPY DIAG:  Other abnormalities of gait and mobility  Muscle weakness  REFERRING DIAG: Unspecified abnormalities of gait and mobility [R26.9]   Rationale for Evaluation and Treatment:  Rehabilitation  SUBJECTIVE:  PERTINENT PAST HISTORY:  Osteoporosis      PRECAUTIONS: Fall  WEIGHT BEARING RESTRICTIONS No  FALLS:  Has  patient fallen in last 6 months? No, Number of falls: 0  MOI/History of condition:  Onset date: slow onset with worsening over the last few months  SUBJECTIVE STATEMENT  07/01/2024:  Pt reports that she felt good after last visit.  Has been completing her HEP.  EVAL: Pt is a 83 y.o. female who presents to clinic with chief complaint of progressively flexed posture which makes her feel less stable.  She has no significant pain.  Was going to a chiro which she felt was helpful for her posture.  Pain:  Are you having pain? No   Occupation: retired  Education Administrator: [employer] used intermittently   Higher Education Careers Adviser Dominance: NA  Patient Goals/Specific Activities: lifting, improving strength, posture   OBJECTIVE:    GENERAL OBSERVATION/GAIT: Significant forward flexed trunk during gait  LUMBAR AROM  AROM AROM  (Eval)  Flexion   Extension limited by 75%  Right lateral flexion limited by 25%  Left lateral flexion limited by 25%  Right rotation   Left rotation     (Blank rows = not tested)   LE MMT:  MMT Right (Eval) Left (Eval)  Hip flexion (L2, L3) 4 4  Knee extension (L3) 4 4  Knee flexion 4 4  Hip abduction 3 3  Hip extension 3 3  Hip external rotation    Hip internal rotation    Hip adduction    Ankle dorsiflexion (L4)  Ankle plantarflexion (S1)    Ankle inversion    Ankle eversion    Great Toe ext (L5)    Grossly     (Blank rows = not tested, score listed is out of 5 possible points OR may be listed in lbs of force.  N = WNL, D = diminished, C = clear for gross weakness with myotome testing, * = concordant pain with testing)  LE ROM:  ROM Right (Eval) Left (Eval)  Hip flexion    Hip extension    Hip abduction    Hip adduction    Hip internal rotation    Hip external rotation    Knee extension    Knee flexion    Ankle dorsiflexion n n  Ankle plantarflexion    Ankle inversion    Ankle eversion     (Blank rows = not tested, N = WNL, * = concordant pain  with testing)  Functional Tests  Eval    30'' STS: 10x  UE used? Y    Progressive balance screen (highest level completed for >/= 10''):  Feet together: 10'' Semi Tandem: R in rear 10'', L in rear 10''  bil unstble Tandem: R in rear <10'', L in rear <10''                                                        PATIENT SURVEYS:  Patient Specific Functional Scale:  Activity Eval         Standing upright during walking 3         Failing to get up from chair without UE 1         Complete bridge 1                   Average 1.66          (Activities rated 0-10/10.  10 represents able to perform at prior level while 0 represents unable to perform. )  HOME EXERCISE PROGRAM: Access Code: [email or web address] URL: [email or web address] Date: 06/29/2024 Prepared by: Helene Gasmen  Exercises - Sit to Stand Without Arm Support  - 1 x daily - 4-7 x weekly - 3 sets - 5 reps - Standing Shoulder Row with Anchored Resistance  - 1 x daily - 4-7 x weekly - 2-3 sets - 10 reps - Standing Romberg to 3/4 Tandem Stance  - 1 x daily - 7 x weekly - 1 sets - 3 reps - 45'' hold - Standing Hip Abduction with Counter Support  - 1 x daily - 7 x weekly - 2 sets - 10 reps - Standing Hip Extension with Counter Support  - 1 x daily - 7 x weekly - 2 sets - 10 reps  Treatment priorities   Eval        Thoracic/lumbar ext mobility        Hip ext/lumbar ext strength        balance        STS progression                 TODAY'S TREATMENT:  [facility] Adult PT Treatment  07/01/2024:  Therapeutic Exercise:  nu-step L5 32m while taking subjective and planning session with patient  Therapeutic Activity  Standing hip abd - 2x10 Standing hip ext - x10 - toe tap Squat with UE  support - 2x5 STS - w/ airex - x10 w/ OH press Heel raises - 2x10 Standing march - CGA Standing shoulder ext - RTB  Neuromuscular re-ed: Semi tandem on foam    ASSESSMENT:  CLINICAL  IMPRESSION: Continuing to work on functional posterior chain strengthening.  Pt able to increase reps of STS today but notes significant fatigue.  Does fairly well with semi-tandem on foam with some trunk sway and self correction with UE intermittently.  Will continue to progress strength and balance to improve safety as tolerated.  OBJECTIVE IMPAIRMENTS: lumbar ROM, hip and core strength, gait, balance  ACTIVITY LIMITATIONS: standing, walking, STS transfers, stability in gait  PERSONAL FACTORS: See medical history and pertinent history   REHAB POTENTIAL: Good  CLINICAL DECISION MAKING: Evolving/moderate complexity  EVALUATION COMPLEXITY: Moderate   GOALS:   SHORT TERM GOALS: Target date: 07/07/2024   Bella will be >75% HEP compliant to improve carryover between sessions and facilitate independent management of condition  Evaluation: ongoing Goal status: MET   LONG TERM GOALS: Target date: 08/04/2024   Jazmene will report confidence in maintenance of balance at time of discharge with advanced HEP  Evaluation/Baseline: unable to self manage Goal status: INITIAL   2.  Yaris will be able to stand for >15'' in tandem stance, to show a significant improvement in balance in order to reduce fall risk   Evaluation/Baseline: unable to maintain semi tandem for >10'' bil Goal status: INITIAL   3.  Bellatrix will improve 30'' STS (MCID 2) to >/= 8x (w/ UE?: n) to show improved LE strength and improved transfers   Evaluation/Baseline: 10x  w/ UE? Y Goal status: INITIAL   4.  Abryanna will be able to walk for with significantly improve upright posture to improve stability in gait and reduce strain on low back  Evaluation/Baseline: limited Goal status: INITIAL   5.  Amnah will show a >/= 2 pt improvement in their average PSFS score as a proxy for functional improvement   Evaluation/Baseline:  Patient Specific Functional Scale:  Activity Eval         Standing upright  during walking 3         Failing to get up from chair without UE 1         Complete bridge 1                   Average 1.66          (Activities rated 0-10/10.  10 represents able to perform at prior level while 0 represents unable to perform. )  Minimum detectable change (90%CI) for average score = 2 points Minimum detectable change (90%CI) for single activity score = 3 points   Goal status: INITIAL    PLAN: PT FREQUENCY: 1-2x/week  PT DURATION: 8 weeks  PLANNED INTERVENTIONS: Therapeutic exercises, Aquatic therapy, Therapeutic activity, Neuro Muscular re-education, Gait training, Patient/Family education, Joint mobilization, Dry Needling, Electrical stimulation, Spinal mobilization and/or manipulation, Moist heat, Taping, Vasopneumatic device, Ionotophoresis 4mg /ml Dexamethasone , and Manual therapy   Helene Gasmen PT, DPT 07/01/2024, 11:03 AM

## 2024-07-20 ENCOUNTER — Encounter: Payer: Self-pay | Admitting: Physical Therapy

## 2024-07-20 ENCOUNTER — Ambulatory Visit: Admitting: Physical Therapy

## 2024-07-20 DIAGNOSIS — R2689 Other abnormalities of gait and mobility: Secondary | ICD-10-CM | POA: Diagnosis not present

## 2024-07-20 DIAGNOSIS — M6281 Muscle weakness (generalized): Secondary | ICD-10-CM

## 2024-07-20 NOTE — Therapy (Signed)
 " OUTPATIENT PHYSICAL THERAPY DAILY NOTE  Patient Name: Paige Ross MRN: [id number] DOB:02-Aug-1940, 83 y.o., female Today's Date: 07/20/2024   PT End of Session - 07/20/24 0931     Visit Number 4    Number of Visits 10   1-2x/week   Date for Recertification  08/04/24    Authorization Type [employer] MCR    Authorization Time Period [employer] M[employer]- approved 10 PT visits 06/24/24-08/04/24    PT Start Time 0930    PT Stop Time 1012    PT Time Calculation (min) 42 min          Past Medical History:  Diagnosis Date   Glaucoma    Past Surgical History:  Procedure Laterality Date   ABDOMINAL HYSTERECTOMY     BUNIONECTOMY  2005   Dr. Minus EDWARDS Right 1989   EYE SURGERY     HAMMER TOE SURGERY Right 2008   3rd toe Dr. Harden FREES TOE SURGERY Right 2013   Dr. Magdalen FREES TOE SURGERY Right 10/09/2020   Right 3rd digit Dr. 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   Dyssomnia 03/11/2022   Essential hypertension 03/11/2022   Hypercalcemia 03/11/2022   Non-toxic multinodular goiter 03/11/2022   Other seasonal allergic rhinitis 03/11/2022   History of colonic polyps 03/11/2022   Hyperparathyroidism 03/11/2022   Pure hypercholesterolemia 03/11/2022   Laryngopharyngeal reflux (LPR) 02/23/2020   Post-nasal drainage 02/23/2020   Allergies 11/09/2019   Right ear impacted cerumen 11/09/2019   Glaucoma     PCP: Rexanne Ingle, MD  REFERRING PROVIDER: Rexanne Ingle, MD  THERAPY DIAG:  Other abnormalities of gait and mobility  Muscle weakness  REFERRING DIAG: Unspecified abnormalities of gait and mobility [R26.9]   Rationale for Evaluation and Treatment:  Rehabilitation  SUBJECTIVE:  PERTINENT PAST HISTORY:  Osteoporosis      PRECAUTIONS: Fall  WEIGHT BEARING RESTRICTIONS No  FALLS:  Has  patient fallen in last 6 months? No, Number of falls: 0  MOI/History of condition:  Onset date: slow onset with worsening over the last few months  SUBJECTIVE STATEMENT  07/20/2024:  Pt reports that she has been HEP compliant.  She feels like her her upright standing tolerance is improving.   EVAL: Pt is a 83 y.o. female who presents to clinic with chief complaint of progressively flexed posture which makes her feel less stable.  She has no significant pain.  Was going to a chiro which she felt was helpful for her posture.  Pain:  Are you having pain? No   Occupation: retired  Education Administrator: [employer] used intermittently   Higher Education Careers Adviser Dominance: NA  Patient Goals/Specific Activities: lifting, improving strength, posture   OBJECTIVE:    GENERAL OBSERVATION/GAIT: Significant forward flexed trunk during gait  LUMBAR AROM  AROM AROM  (Eval)  Flexion   Extension limited by 75%  Right lateral flexion limited by 25%  Left lateral flexion limited by 25%  Right rotation   Left rotation     (Blank rows = not tested)   LE MMT:  MMT Right (Eval) Left (Eval)  Hip flexion (L2, L3) 4 4  Knee extension (L3) 4 4  Knee flexion 4 4  Hip abduction 3 3  Hip extension 3 3  Hip external rotation    Hip internal rotation    Hip adduction  Ankle dorsiflexion (L4)    Ankle plantarflexion (S1)    Ankle inversion    Ankle eversion    Great Toe ext (L5)    Grossly     (Blank rows = not tested, score listed is out of 5 possible points OR may be listed in lbs of force.  N = WNL, D = diminished, C = clear for gross weakness with myotome testing, * = concordant pain with testing)  LE ROM:  ROM Right (Eval) Left (Eval)  Hip flexion    Hip extension    Hip abduction    Hip adduction    Hip internal rotation    Hip external rotation    Knee extension    Knee flexion    Ankle dorsiflexion n n  Ankle plantarflexion    Ankle inversion    Ankle eversion     (Blank rows = not  tested, N = WNL, * = concordant pain with testing)  Functional Tests  Eval    30'' STS: 10x  UE used? Y    Progressive balance screen (highest level completed for >/= 10''):  Feet together: 10'' Semi Tandem: R in rear 10'', L in rear 10''  bil unstble Tandem: R in rear <10'', L in rear <10''                                                        PATIENT SURVEYS:  Patient Specific Functional Scale:  Activity Eval         Standing upright during walking 3         Failing to get up from chair without UE 1         Complete bridge 1                   Average 1.66          (Activities rated 0-10/10.  10 represents able to perform at prior level while 0 represents unable to perform. )  HOME EXERCISE PROGRAM: Access Code: [email or web address] URL: [email or web address] Date: 06/29/2024 Prepared by: Helene Gasmen  Exercises - Sit to Stand Without Arm Support  - 1 x daily - 4-7 x weekly - 3 sets - 5 reps - Standing Shoulder Row with Anchored Resistance  - 1 x daily - 4-7 x weekly - 2-3 sets - 10 reps - Standing Romberg to 3/4 Tandem Stance  - 1 x daily - 7 x weekly - 1 sets - 3 reps - 45'' hold - Standing Hip Abduction with Counter Support  - 1 x daily - 7 x weekly - 2 sets - 10 reps - Standing Hip Extension with Counter Support  - 1 x daily - 7 x weekly - 2 sets - 10 reps  Treatment priorities   Eval        Thoracic/lumbar ext mobility        Hip ext/lumbar ext strength        balance        STS progression                 TODAY'S TREATMENT:  [facility] Adult PT Treatment  07/20/2024:  Therapeutic Exercise:  nu-step L5 40m LE only Seated lumbar ext with 1/2 foam roller  Therapeutic Activity  Standing hip abd - 2x10 - GTB Standing hip  ext - x10 - toe tap - GTB STS - 5x w/ bil 3# OH press 3x5 Standing march - CGA - Not today Standing shoulder ext - RTB  Neuromuscular re-ed: Tandem stance    ASSESSMENT:  CLINICAL IMPRESSION: Focused on  progressing current exercises with goal of posterior chain strengthening and improved posture during gait.  Tolerated well with no adverse effects reported.  Pt has significantly improved balance and feels more confidence in ambulation since starting therapy.    OBJECTIVE IMPAIRMENTS: lumbar ROM, hip and core strength, gait, balance  ACTIVITY LIMITATIONS: standing, walking, STS transfers, stability in gait  PERSONAL FACTORS: See medical history and pertinent history   REHAB POTENTIAL: Good  CLINICAL DECISION MAKING: Evolving/moderate complexity  EVALUATION COMPLEXITY: Moderate   GOALS:   SHORT TERM GOALS: Target date: 07/07/2024   Kambrie will be >75% HEP compliant to improve carryover between sessions and facilitate independent management of condition  Evaluation: ongoing Goal status: MET   LONG TERM GOALS: Target date: 08/04/2024   Kimiya will report confidence in maintenance of balance at time of discharge with advanced HEP  Evaluation/Baseline: unable to self manage Goal status: INITIAL   2.  Romaine will be able to stand for >15'' in tandem stance, to show a significant improvement in balance in order to reduce fall risk   Evaluation/Baseline: unable to maintain semi tandem for >10'' bil 12/31: MET bil  Goal status: MET   3.  Sofi will improve 30'' STS (MCID 2) to >/= 8x (w/ UE?: n) to show improved LE strength and improved transfers   Evaluation/Baseline: 10x  w/ UE? Y Goal status: INITIAL   4.  Keena will be able to walk for with significantly improve upright posture to improve stability in gait and reduce strain on low back  Evaluation/Baseline: limited Goal status: INITIAL   5.  Ahmia will show a >/= 2 pt improvement in their average PSFS score as a proxy for functional improvement   Evaluation/Baseline:  Patient Specific Functional Scale:  Activity Eval         Standing upright during walking 3         Failing to get up from chair  without UE 1         Complete bridge 1                   Average 1.66          (Activities rated 0-10/10.  10 represents able to perform at prior level while 0 represents unable to perform. )  Minimum detectable change (90%CI) for average score = 2 points Minimum detectable change (90%CI) for single activity score = 3 points   Goal status: INITIAL    PLAN: PT FREQUENCY: 1-2x/week  PT DURATION: 8 weeks  PLANNED INTERVENTIONS: Therapeutic exercises, Aquatic therapy, Therapeutic activity, Neuro Muscular re-education, Gait training, Patient/Family education, Joint mobilization, Dry Needling, Electrical stimulation, Spinal mobilization and/or manipulation, Moist heat, Taping, Vasopneumatic device, Ionotophoresis 4mg /ml Dexamethasone , and Manual therapy   Helene Gasmen PT, DPT 07/20/2024, 11:49 AM    "

## 2024-07-25 ENCOUNTER — Ambulatory Visit: Attending: Internal Medicine | Admitting: Physical Therapy

## 2024-07-25 ENCOUNTER — Encounter: Payer: Self-pay | Admitting: Physical Therapy

## 2024-07-25 DIAGNOSIS — R2689 Other abnormalities of gait and mobility: Secondary | ICD-10-CM | POA: Insufficient documentation

## 2024-07-25 DIAGNOSIS — M6281 Muscle weakness (generalized): Secondary | ICD-10-CM | POA: Insufficient documentation

## 2024-07-25 NOTE — Therapy (Signed)
 " OUTPATIENT PHYSICAL THERAPY DAILY NOTE  Patient Name: Paige Ross MRN: [id number] DOB:1941-02-08, 84 y.o., female Today's Date: 07/25/2024   PT End of Session - 07/25/24 1103     Visit Number 5    Number of Visits 10   1-2x/week   Date for Recertification  08/04/24    Authorization Type [employer] MCR    Authorization Time Period [facility]- approved 10 PT visits 06/24/24-08/04/24    PT Start Time 1100    PT Stop Time 1141    PT Time Calculation (min) 41 min          Past Medical History:  Diagnosis Date   Glaucoma    Past Surgical History:  Procedure Laterality Date   ABDOMINAL HYSTERECTOMY     BUNIONECTOMY  2005   Dr. Minus EDWARDS Right 1989   EYE SURGERY     HAMMER TOE SURGERY Right 2008   3rd toe Dr. Harden FREES TOE SURGERY Right 2013   Dr. Magdalen FREES TOE SURGERY Right 10/09/2020   Right 3rd digit Dr. 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   Dyssomnia 03/11/2022   Essential hypertension 03/11/2022   Hypercalcemia 03/11/2022   Non-toxic multinodular goiter 03/11/2022   Other seasonal allergic rhinitis 03/11/2022   History of colonic polyps 03/11/2022   Hyperparathyroidism 03/11/2022   Pure hypercholesterolemia 03/11/2022   Laryngopharyngeal reflux (LPR) 02/23/2020   Post-nasal drainage 02/23/2020   Allergies 11/09/2019   Right ear impacted cerumen 11/09/2019   Glaucoma     PCP: Rexanne Ingle, MD  REFERRING PROVIDER: Rexanne Ingle, MD  THERAPY DIAG:  Other abnormalities of gait and mobility  Muscle weakness  REFERRING DIAG: Unspecified abnormalities of gait and mobility [R26.9]   Rationale for Evaluation and Treatment:  Rehabilitation  SUBJECTIVE:  PERTINENT PAST HISTORY:  Osteoporosis      PRECAUTIONS: Fall  WEIGHT BEARING RESTRICTIONS No  FALLS:  Has patient  fallen in last 6 months? No, Number of falls: 0  MOI/History of condition:  Onset date: slow onset with worsening over the last few months  SUBJECTIVE STATEMENT  07/25/2024:  Pt reports things are going well.  EVAL: Pt is a 84 y.o. female who presents to clinic with chief complaint of progressively flexed posture which makes her feel less stable.  She has no significant pain.  Was going to a chiro which she felt was helpful for her posture.  Pain:  Are you having pain? No   Occupation: retired  Education Administrator: [employer] used intermittently   Higher Education Careers Adviser Dominance: NA  Patient Goals/Specific Activities: lifting, improving strength, posture   OBJECTIVE:    GENERAL OBSERVATION/GAIT: Significant forward flexed trunk during gait  LUMBAR AROM  AROM AROM  (Eval)  Flexion   Extension limited by 75%  Right lateral flexion limited by 25%  Left lateral flexion limited by 25%  Right rotation   Left rotation     (Blank rows = not tested)   LE MMT:  MMT Right (Eval) Left (Eval)  Hip flexion (L2, L3) 4 4  Knee extension (L3) 4 4  Knee flexion 4 4  Hip abduction 3 3  Hip extension 3 3  Hip external rotation    Hip internal rotation    Hip adduction    Ankle dorsiflexion (L4)    Ankle plantarflexion (S1)    Ankle  inversion    Ankle eversion    Great Toe ext (L5)    Grossly     (Blank rows = not tested, score listed is out of 5 possible points OR may be listed in lbs of force.  N = WNL, D = diminished, C = clear for gross weakness with myotome testing, * = concordant pain with testing)  LE ROM:  ROM Right (Eval) Left (Eval)  Hip flexion    Hip extension    Hip abduction    Hip adduction    Hip internal rotation    Hip external rotation    Knee extension    Knee flexion    Ankle dorsiflexion n n  Ankle plantarflexion    Ankle inversion    Ankle eversion     (Blank rows = not tested, N = WNL, * = concordant pain with testing)  Functional Tests  Eval    30''  STS: 10x  UE used? Y    Progressive balance screen (highest level completed for >/= 10''):  Feet together: 10'' Semi Tandem: R in rear 10'', L in rear 10''  bil unstble Tandem: R in rear <10'', L in rear <10''                                                        PATIENT SURVEYS:  Patient Specific Functional Scale:  Activity Eval         Standing upright during walking 3         Failing to get up from chair without UE 1         Complete bridge 1                   Average 1.66          (Activities rated 0-10/10.  10 represents able to perform at prior level while 0 represents unable to perform. )  HOME EXERCISE PROGRAM: Access Code: [email or web address] URL: [email or web address] Date: 06/29/2024 Prepared by: Helene Gasmen  Exercises - Sit to Stand Without Arm Support  - 1 x daily - 4-7 x weekly - 3 sets - 5 reps - Standing Shoulder Row with Anchored Resistance  - 1 x daily - 4-7 x weekly - 2-3 sets - 10 reps - Standing Romberg to 3/4 Tandem Stance  - 1 x daily - 7 x weekly - 1 sets - 3 reps - 45'' hold - Standing Hip Abduction with Counter Support  - 1 x daily - 7 x weekly - 2 sets - 10 reps - Standing Hip Extension with Counter Support  - 1 x daily - 7 x weekly - 2 sets - 10 reps  Treatment priorities   Eval 1/5       Thoracic/lumbar ext mobility        Hip ext/lumbar ext strength        balance        STS progression         L hip strength        TODAY'S TREATMENT:  [facility] Adult PT Treatment  07/25/2024:  Therapeutic Exercise:  nu-step L6 26m LE only Seated lumbar ext with 1/2 foam roller  Therapeutic Activity  Lateral walking with GTB at knees Monster walk with GTB at knees Standing shoulder ext - RTB - w/ march  Staggered STS - 2x5 ea  Neuromuscular re-ed: Narrow BOS walking  Consider bent row, sled push pull, ball movements  ASSESSMENT:  CLINICAL IMPRESSION: Focused on progressing current exercises with goal of posterior  chain strengthening and improved posture during gait.  Tolerated well with no adverse effects reported.  Continues to demonstrate improved balance.  Pt does demonstrate more difficulty with L SLS vs R and we are starting to focus on more isolated strengthening and balance activities.  OBJECTIVE IMPAIRMENTS: lumbar ROM, hip and core strength, gait, balance  ACTIVITY LIMITATIONS: standing, walking, STS transfers, stability in gait  PERSONAL FACTORS: See medical history and pertinent history   REHAB POTENTIAL: Good  CLINICAL DECISION MAKING: Evolving/moderate complexity  EVALUATION COMPLEXITY: Moderate   GOALS:   SHORT TERM GOALS: Target date: 07/07/2024   Clariza will be >75% HEP compliant to improve carryover between sessions and facilitate independent management of condition  Evaluation: ongoing Goal status: MET   LONG TERM GOALS: Target date: 08/04/2024   Ed will report confidence in maintenance of balance at time of discharge with advanced HEP  Evaluation/Baseline: unable to self manage Goal status: INITIAL   2.  Kayal will be able to stand for >15'' in tandem stance, to show a significant improvement in balance in order to reduce fall risk   Evaluation/Baseline: unable to maintain semi tandem for >10'' bil 12/31: MET bil  Goal status: MET   3.  Vinessa will improve 30'' STS (MCID 2) to >/= 8x (w/ UE?: n) to show improved LE strength and improved transfers   Evaluation/Baseline: 10x  w/ UE? Y Goal status: INITIAL   4.  Tattiana will be able to walk for with significantly improve upright posture to improve stability in gait and reduce strain on low back  Evaluation/Baseline: limited Goal status: INITIAL   5.  Nneka will show a >/= 2 pt improvement in their average PSFS score as a proxy for functional improvement   Evaluation/Baseline:  Patient Specific Functional Scale:  Activity Eval         Standing upright during walking 3         Failing to  get up from chair without UE 1         Complete bridge 1                   Average 1.66          (Activities rated 0-10/10.  10 represents able to perform at prior level while 0 represents unable to perform. )  Minimum detectable change (90%CI) for average score = 2 points Minimum detectable change (90%CI) for single activity score = 3 points   Goal status: INITIAL    PLAN: PT FREQUENCY: 1-2x/week  PT DURATION: 8 weeks  PLANNED INTERVENTIONS: Therapeutic exercises, Aquatic therapy, Therapeutic activity, Neuro Muscular re-education, Gait training, Patient/Family education, Joint mobilization, Dry Needling, Electrical stimulation, Spinal mobilization and/or manipulation, Moist heat, Taping, Vasopneumatic device, Ionotophoresis 4mg /ml Dexamethasone , and Manual therapy   Helene Gasmen PT, DPT 07/25/2024, 12:50 PM    "

## 2024-08-01 ENCOUNTER — Ambulatory Visit: Admitting: Physical Therapy

## 2024-08-01 ENCOUNTER — Encounter: Payer: Self-pay | Admitting: Physical Therapy

## 2024-08-01 DIAGNOSIS — R2689 Other abnormalities of gait and mobility: Secondary | ICD-10-CM

## 2024-08-01 DIAGNOSIS — M6281 Muscle weakness (generalized): Secondary | ICD-10-CM

## 2024-08-01 NOTE — Therapy (Signed)
 " OUTPATIENT PHYSICAL THERAPY DAILY NOTE  Patient Name: Paige Ross MRN: [id number] DOB:1941-04-26, 84 y.o., female Today's Date: 08/01/2024   PT End of Session - 08/01/24 1101     Visit Number 6    Number of Visits 10   1-2x/week   Date for Recertification  09/26/24    Authorization Type [employer] MCR    Authorization Time Period [facility]- approved 10 PT visits 06/24/24-08/04/24    PT Start Time 1100    PT Stop Time 1141    PT Time Calculation (min) 41 min          Past Medical History:  Diagnosis Date   Glaucoma    Past Surgical History:  Procedure Laterality Date   ABDOMINAL HYSTERECTOMY     BUNIONECTOMY  2005   Dr. Minus EDWARDS Right 1989   EYE SURGERY     HAMMER TOE SURGERY Right 2008   3rd toe Dr. Harden FREES TOE SURGERY Right 2013   Dr. Magdalen   HAMMER TOE SURGERY Right 10/09/2020   Right 3rd digit Dr. 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   Dyssomnia 03/11/2022   Essential hypertension 03/11/2022   Hypercalcemia 03/11/2022   Non-toxic multinodular goiter 03/11/2022   Other seasonal allergic rhinitis 03/11/2022   History of colonic polyps 03/11/2022   Hyperparathyroidism 03/11/2022   Pure hypercholesterolemia 03/11/2022   Laryngopharyngeal reflux (LPR) 02/23/2020   Post-nasal drainage 02/23/2020   Allergies 11/09/2019   Right ear impacted cerumen 11/09/2019   Glaucoma     PCP: Rexanne Ingle, MD  REFERRING PROVIDER: Rexanne Ingle, MD  THERAPY DIAG:  Other abnormalities of gait and mobility - Plan: PT plan of care cert/re-cert  Muscle weakness - Plan: PT plan of care cert/re-cert  REFERRING DIAG: Unspecified abnormalities of gait and mobility [R26.9]   Rationale for Evaluation and Treatment:  Rehabilitation  SUBJECTIVE:  PERTINENT PAST HISTORY:  Osteoporosis       PRECAUTIONS: Fall  WEIGHT BEARING RESTRICTIONS No  FALLS:  Has patient fallen in last 6 months? No, Number of falls: 0  MOI/History of condition:  Onset date: slow onset with worsening over the last few months  SUBJECTIVE STATEMENT  08/01/2024:  Pt reports that she has seen some improvement in her sit to stands and posture.  She has had some improvement in her balance but still feels unsteady at times.  EVAL: Pt is a 84 y.o. female who presents to clinic with chief complaint of progressively flexed posture which makes her feel less stable.  She has no significant pain.  Was going to a chiro which she felt was helpful for her posture.  Pain:  Are you having pain? No   Occupation: retired  Education Administrator: S[employer] used intermittently   Higher Education Careers Adviser Dominance: NA  Patient Goals/Specific Activities: lifting, improving strength, posture   OBJECTIVE:    GENERAL OBSERVATION/GAIT: Significant forward flexed trunk during gait  LUMBAR AROM  AROM AROM  (Eval)  Flexion   Extension limited by 75%  Right lateral flexion limited by 25%  Left lateral flexion limited by 25%  Right rotation   Left rotation     (Blank rows = not tested)   LE MMT:  MMT Right (Eval) Left (Eval) R/L 1/12  Hip flexion (L2, L3) 4 4 4+/4+  Knee extension (L3) 4 4 4+/4+  Knee flexion 4  4 4+/4+  Hip abduction 3 3 3+/3+  Hip extension 3 3 4/4  Hip external rotation     Hip internal rotation     Hip adduction     Ankle dorsiflexion (L4)     Ankle plantarflexion (S1)     Ankle inversion     Ankle eversion     Great Toe ext (L5)     Grossly      (Blank rows = not tested, score listed is out of 5 possible points OR may be listed in lbs of force.  N = WNL, D = diminished, C = clear for gross weakness with myotome testing, * = concordant pain with testing)  LE ROM:  ROM Right (Eval) Left (Eval)  Hip flexion    Hip extension    Hip abduction    Hip adduction    Hip internal rotation    Hip  external rotation    Knee extension    Knee flexion    Ankle dorsiflexion n n  Ankle plantarflexion    Ankle inversion    Ankle eversion     (Blank rows = not tested, N = WNL, * = concordant pain with testing)  Functional Tests  Eval    30'' STS: 10x  UE used? Y    Progressive balance screen (highest level completed for >/= 10''):  Feet together: 10'' Semi Tandem: R in rear 10'', L in rear 10''  bil unstble Tandem: R in rear <10'', L in rear <10''                                                        PATIENT SURVEYS:  Patient Specific Functional Scale:  Activity Eval         Standing upright during walking 3         Failing to get up from chair without UE 1         Complete bridge 1                   Average 1.66          (Activities rated 0-10/10.  10 represents able to perform at prior level while 0 represents unable to perform. )  HOME EXERCISE PROGRAM: Access Code: [email or web address] URL: [email or web address] Date: 08/01/2024 Prepared by: Helene Gasmen  Exercises - Sit to Stand Without Arm Support  - 1 x daily - 4-7 x weekly - 3 sets - 5 reps - Standing Shoulder Row with Anchored Resistance  - 1 x daily - 4-7 x weekly - 2-3 sets - 10 reps - Shoulder extension with resistance - Neutral  - 1 x daily - 4-7 x weekly - 3 sets - 10 reps - Side Stepping with Resistance at Thighs and Counter Support  - 1 x daily - 7 x weekly - 3 sets - 10 reps - Forward Monster Walk with Resistance at [employer] and Counter Support  - 1 x daily - 7 x weekly - 3 sets - 10 reps - Seated Diagonal Lift with Medicine Ball  - 1 x daily - 7 x weekly - 3 sets - 5-10 reps - Seated Exhale with Pelvic Floor Contraction and Med Ball Lift  - 1 x daily - 7 x weekly - 3 sets - 10 reps  Treatment priorities  Eval 1/5       Thoracic/lumbar ext mobility        Hip ext/lumbar ext strength        balance        STS progression         L hip strength        TODAY'S  TREATMENT:  [facility] Adult PT Treatment 1/12:  Therapeutic Activity  Lateral walking with GTB at knees Monster walk with GTB at knees Step overs fwd and backward collecting information for goals, checking progress, and reviewing with patient  Neuromuscular re-ed: Narrow BOS walking Lateral walking on airex beam  Consider bent row, sled push pull, ball movements  [facility] Adult PT Treatment :  Therapeutic Exercise:  nu-step L6 23m LE only Seated lumbar ext with 1/2 foam roller  Therapeutic Activity  Lateral walking with GTB at knees Monster walk with GTB at knees Standing shoulder ext - RTB - w/ march Staggered STS - 2x5 ea  Neuromuscular re-ed: Narrow BOS walking  Consider bent row, sled push pull, ball movements  ASSESSMENT:  CLINICAL IMPRESSION: Upon goal recheck Hattye has made significant progress toward all short and long term goals.  She is able to perform STS with no UE support at home and has improved to 6x w/ no UE support with 30'' STS.  Her static balance and PSFS goals have been met.  Her most significant remaining deficits include dynamic balance activities and posture.  She will benefit from continued skilled therapy to address deficits and improve safety during daily tasks.  OBJECTIVE IMPAIRMENTS: lumbar ROM, hip and core strength, gait, balance  ACTIVITY LIMITATIONS: standing, walking, STS transfers, stability in gait  PERSONAL FACTORS: See medical history and pertinent history   REHAB POTENTIAL: Good  CLINICAL DECISION MAKING: Evolving/moderate complexity  EVALUATION COMPLEXITY: Moderate   GOALS:   SHORT TERM GOALS: Target date: 07/07/2024   Torry will be >75% HEP compliant to improve carryover between sessions and facilitate independent management of condition  Evaluation: ongoing Goal status: MET   LONG TERM GOALS: Target date: 08/04/2024 extended to 09/26/2024    Danni will report confidence in maintenance of balance at time of  discharge with advanced HEP  Evaluation/Baseline: unable to self manage 1/12: ongoing, still requires CGA for safety Goal status: Ongoing    2.  Aiysha will be able to stand for >15'' in tandem stance, to show a significant improvement in balance in order to reduce fall risk   Evaluation/Baseline: unable to maintain semi tandem for >10'' bil 12/31: MET bil  Goal status: MET   3.  Zailah will improve 30'' STS (MCID 2) to >/= 8x (w/ UE?: n) to show improved LE strength and improved transfers   Evaluation/Baseline: 10x  w/ UE? Y 1/12: 6x w/o UE support Goal status: Ongoing    4.  Kirandeep will be able to walk for with significantly improve upright posture to improve stability in gait and reduce strain on low back  Evaluation/Baseline: limited 1/12: ongoing, continues to have forward flexed posture but does feel stronger with lumbar/hip ext Goal status: ongoing    5.  Yanitza will show a >/= 2 pt improvement in their average PSFS score as a proxy for functional improvement   Evaluation/Baseline:  Patient Specific Functional Scale:  Activity Eval 1/12        Standing upright during walking 3 4        Failing to get up from chair without UE 1 8  Complete bridge 1 6                  Average 1.66 6         (Activities rated 0-10/10.  10 represents able to perform at prior level while 0 represents unable to perform. )  Minimum detectable change (90%CI) for average score = 2 points Minimum detectable change (90%CI) for single activity score = 3 points   Goal status: MET    PLAN: PT FREQUENCY: 1-2x/week  PT DURATION: 8 weeks  PLANNED INTERVENTIONS: Therapeutic exercises, Aquatic therapy, Therapeutic activity, Neuro Muscular re-education, Gait training, Patient/Family education, Joint mobilization, Dry Needling, Electrical stimulation, Spinal mobilization and/or manipulation, Moist heat, Taping, Vasopneumatic device, Ionotophoresis 4mg /ml Dexamethasone , and  Manual therapy   Helene Gasmen PT, DPT 08/01/2024, 12:37 PM    "

## 2024-08-15 ENCOUNTER — Ambulatory Visit: Admitting: Physical Therapy

## 2024-08-16 ENCOUNTER — Ambulatory Visit: Admitting: Podiatry

## 2024-08-29 ENCOUNTER — Ambulatory Visit: Admitting: Physical Therapy

## 2024-09-05 ENCOUNTER — Ambulatory Visit: Admitting: Physical Therapy

## 2024-10-12 ENCOUNTER — Ambulatory Visit: Admitting: Podiatry
